# Patient Record
Sex: Male | Born: 1970 | Race: White | Hispanic: No | Smoking: Never smoker
Health system: Southern US, Academic
[De-identification: ages and names within clinical notes are randomized; demographics above are authoritative.]

## PROBLEM LIST (undated history)

## (undated) DIAGNOSIS — R6889 Other general symptoms and signs: Secondary | ICD-10-CM

## (undated) DIAGNOSIS — G8929 Other chronic pain: Secondary | ICD-10-CM

## (undated) DIAGNOSIS — E538 Deficiency of other specified B group vitamins: Secondary | ICD-10-CM

## (undated) DIAGNOSIS — N200 Calculus of kidney: Secondary | ICD-10-CM

## (undated) DIAGNOSIS — Z9981 Dependence on supplemental oxygen: Secondary | ICD-10-CM

## (undated) DIAGNOSIS — T148XXA Other injury of unspecified body region, initial encounter: Secondary | ICD-10-CM

## (undated) DIAGNOSIS — M549 Dorsalgia, unspecified: Secondary | ICD-10-CM

## (undated) DIAGNOSIS — I219 Acute myocardial infarction, unspecified: Secondary | ICD-10-CM

## (undated) DIAGNOSIS — M6281 Muscle weakness (generalized): Secondary | ICD-10-CM

## (undated) HISTORY — PX: NECK SURGERY: SHX720

## (undated) HISTORY — PX: SHOULDER SURGERY: SHX246

## (undated) HISTORY — PX: HAND RECONSTRUCTION: SHX1730

## (undated) HISTORY — PX: HX GALL BLADDER SURGERY/CHOLE: SHX55

## (undated) HISTORY — PX: HX APPENDECTOMY: SHX54

## (undated) HISTORY — PX: HX BACK SURGERY: SHX140

## (undated) HISTORY — PX: KNEE SURGERY: SHX244

## (undated) HISTORY — DX: Dorsalgia, unspecified: M54.9

---
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
## 2018-03-02 IMAGING — CR XRAY LUMBAR SPINE COMPLETE
1 series · 5 of 5 positions shown · non-contrast
Comparison: None previous available.

Exam:   

Lumbosacral spine complete with oblique views 4
HISTORY: Back pain. Trauma due to fall a few weeks ago.

[Series 1: view not recorded · 0.17mm/px · 5 of 5 slices shown]
[im 1/5]
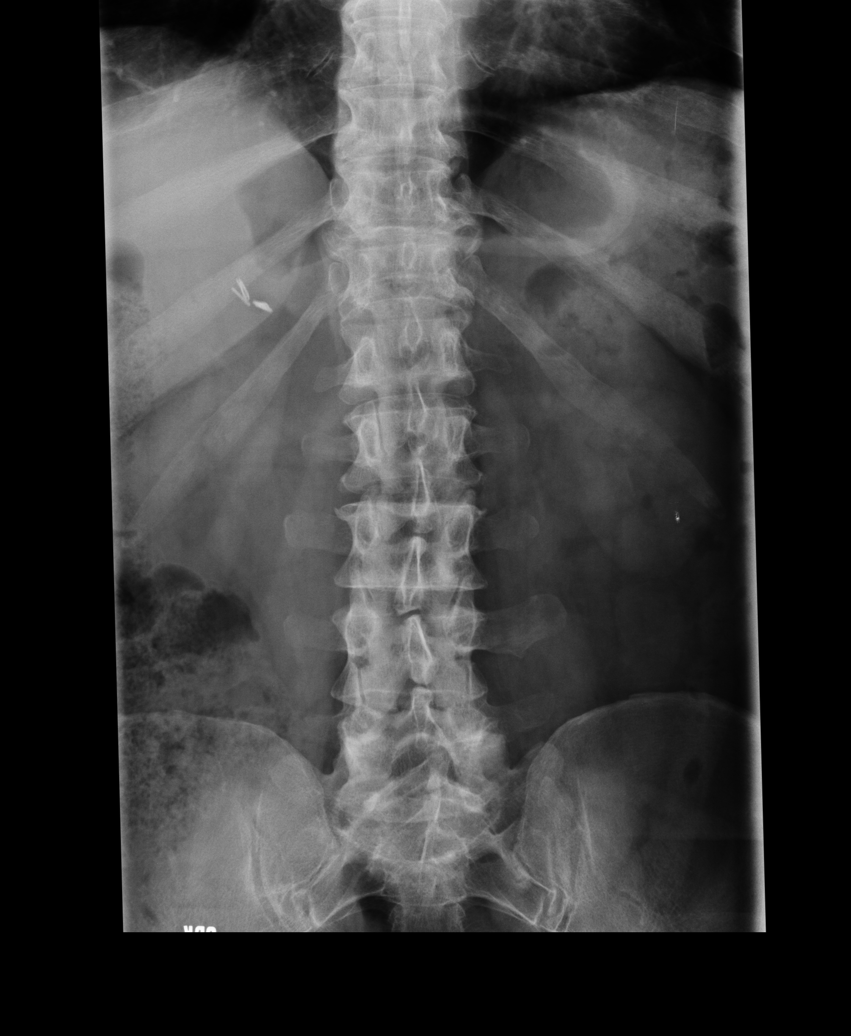
[im 2/5]
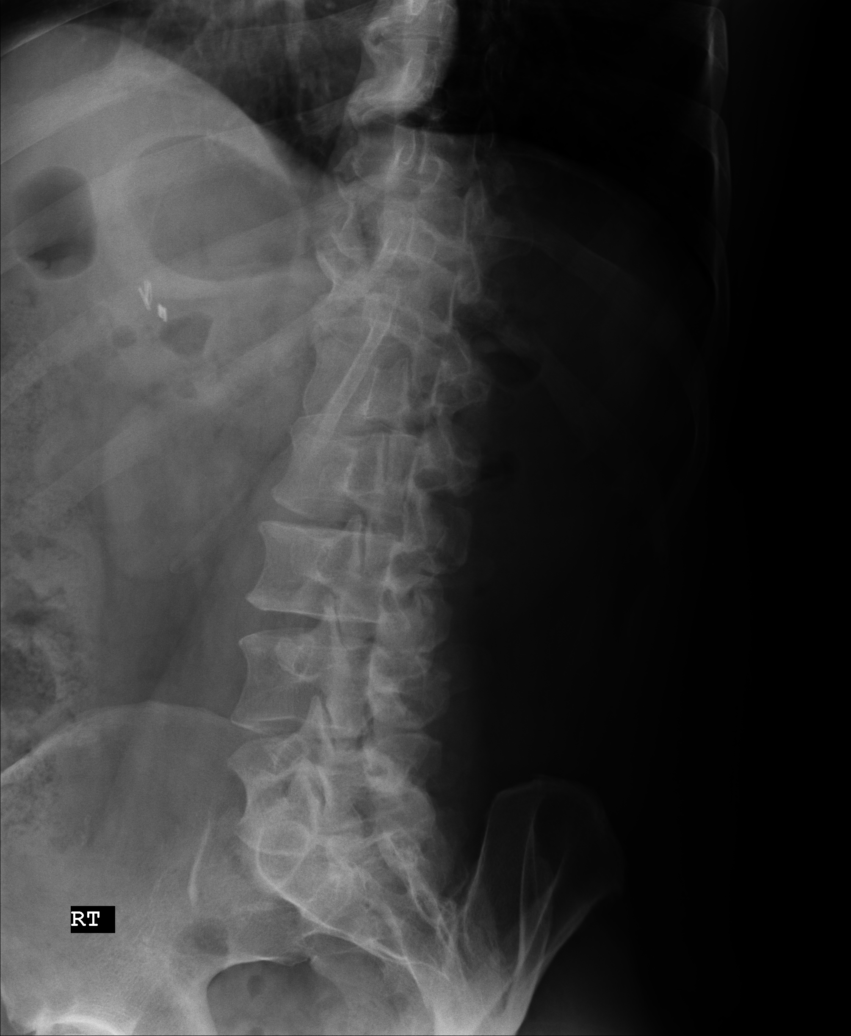
[im 3/5]
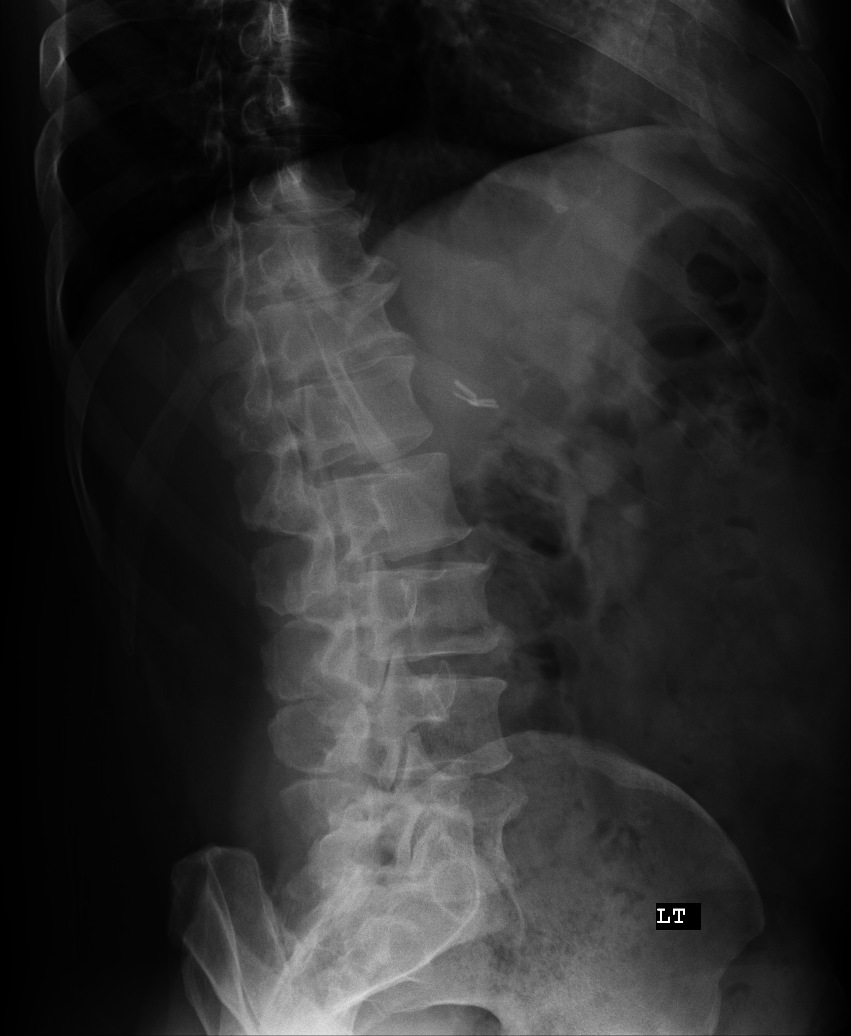
[im 4/5]
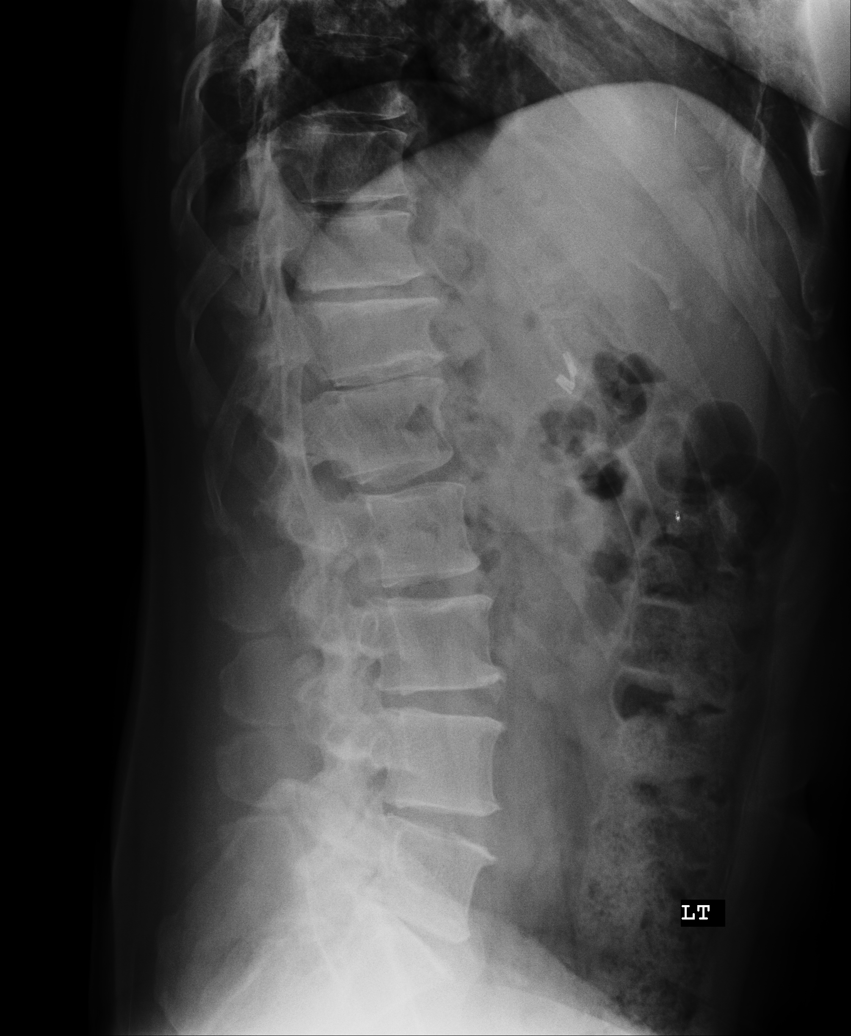
[im 5/5]
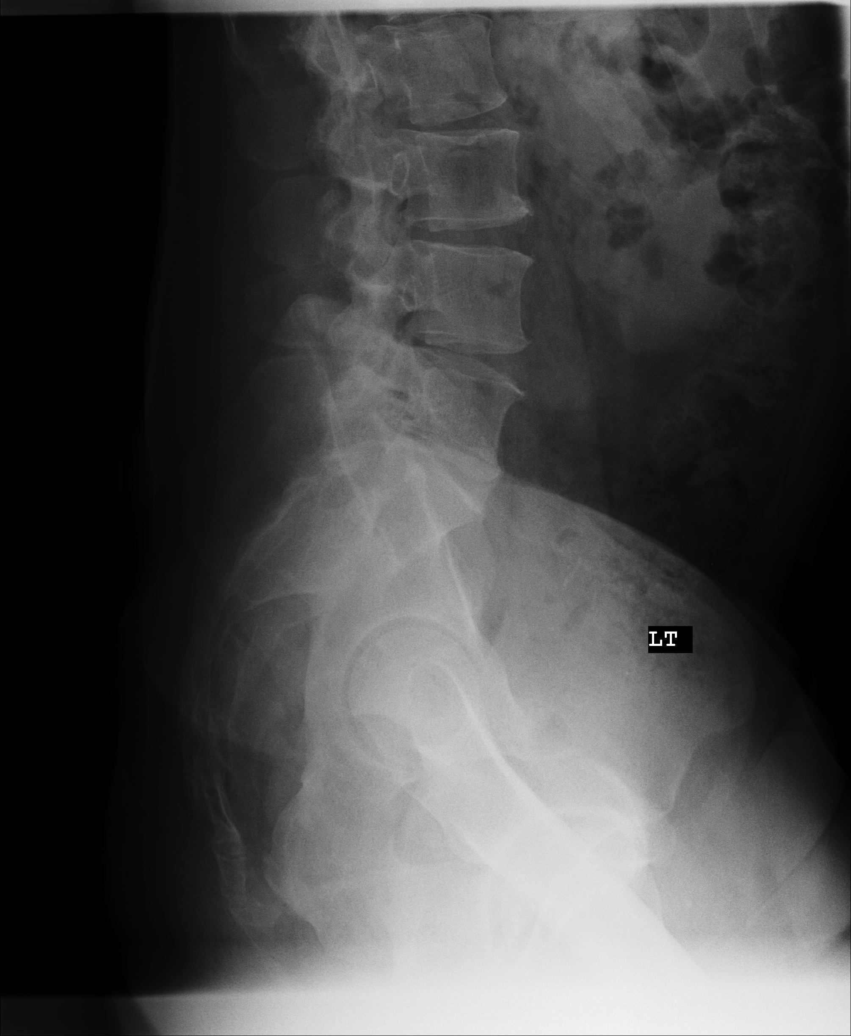

[5 of 5 positions shown; findings below may reference images not displayed]

FINDINGS: No acute findings of lumbar vertebrae are seen. Significant multi-level degenerative disc changes are seen in the mid lumbar region. Degenerative disc disease is also noted in the lower thoracic region.
IMPRESSION: Multi-level degenerative disc changes of lower thoracic and mid lumbar discs. No acute bony lesions are visible. If symptoms are localized and persistent, further evaluation with MRI is indicated.

## 2018-11-15 IMAGING — US US RETROPERITONEAL COMPLETE
1 series · 14 of 25 positions shown · non-contrast
Comparison: None available.

EXAM:  US RETROPERITONEAL COMPLETE,

EXAM:   US PELVIC LIMITED
INDICATION: Hematuria.

[Series 4: us retroperitoneal complete · 0.35mm/px · 14 of 70 slices shown]
[im 1/70]
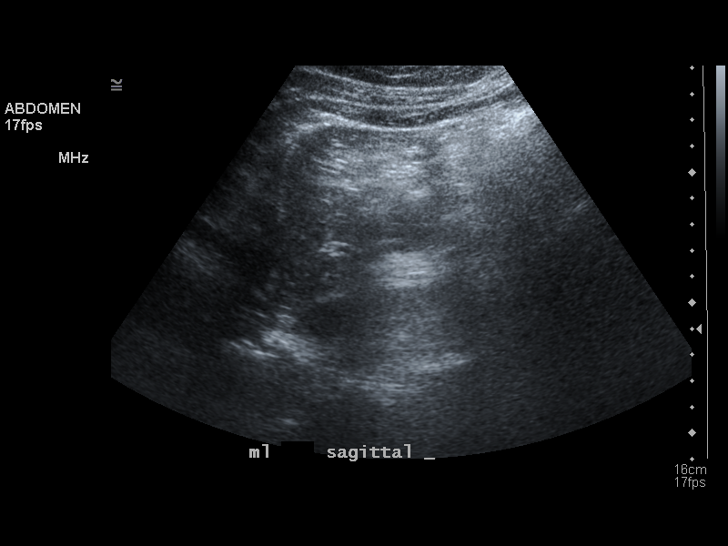
[im 6/70]
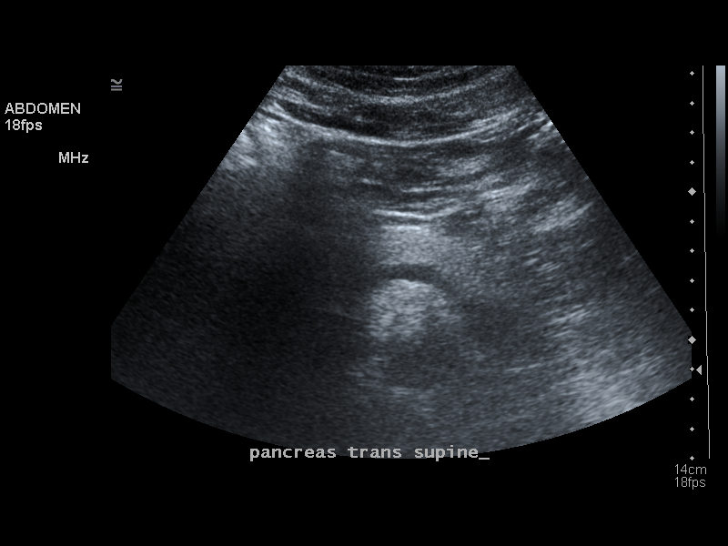
[im 12/70]
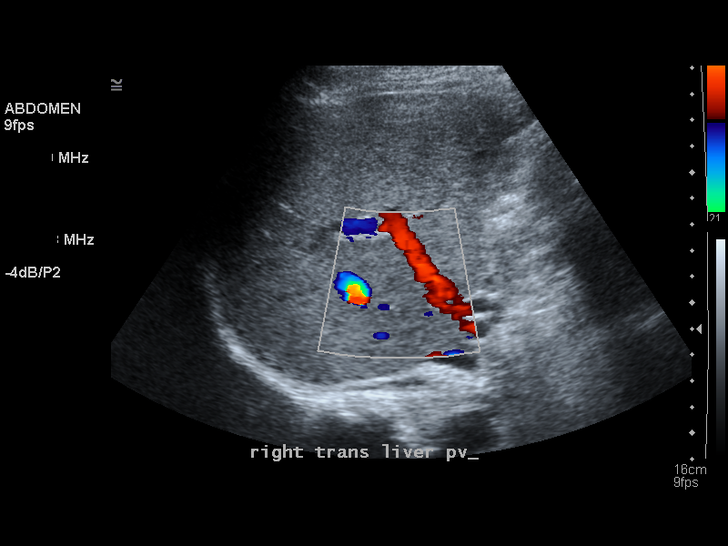
[im 18/70]
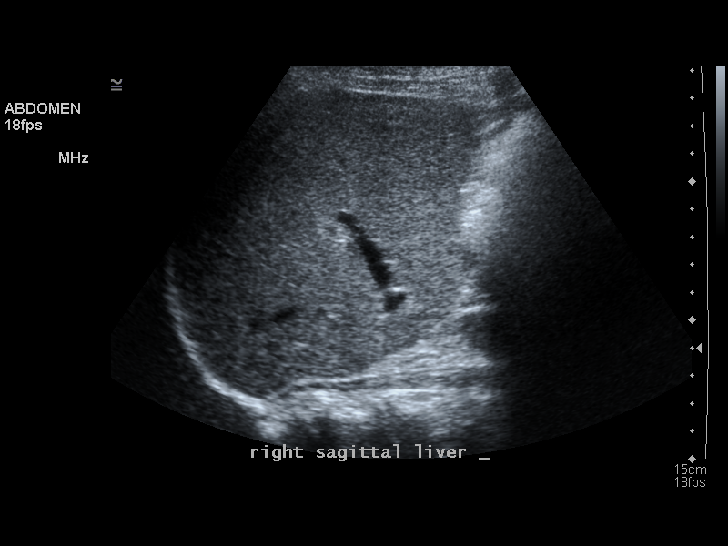
[im 24/70]
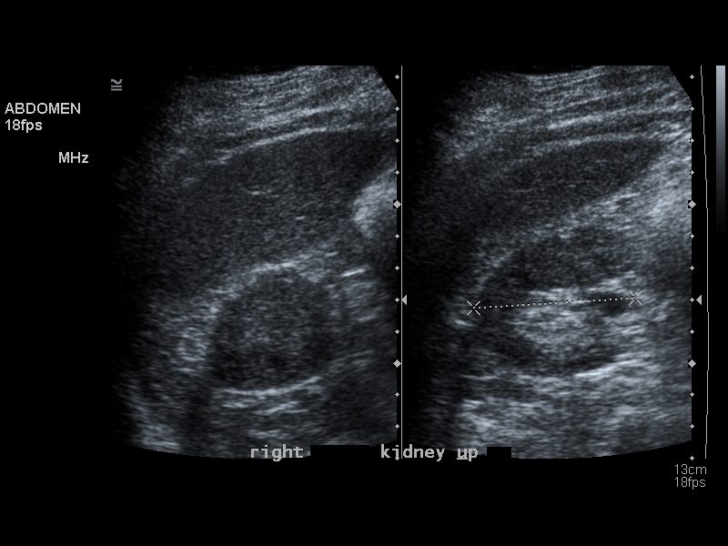
[im 26/70]
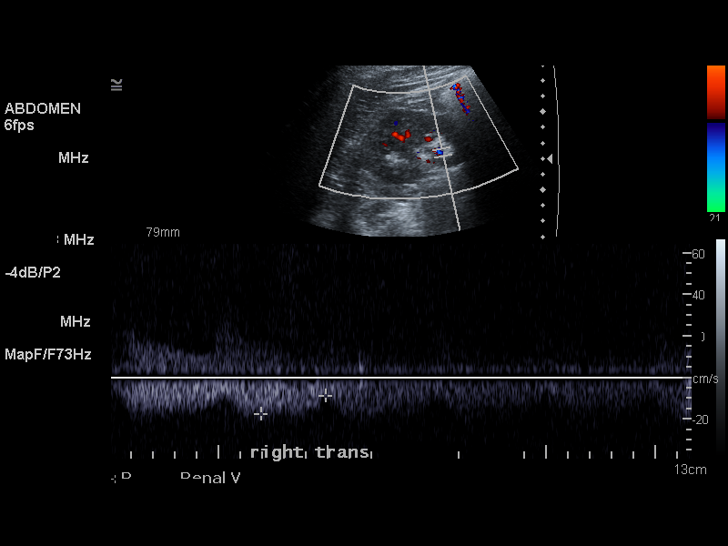
[im 32/70]
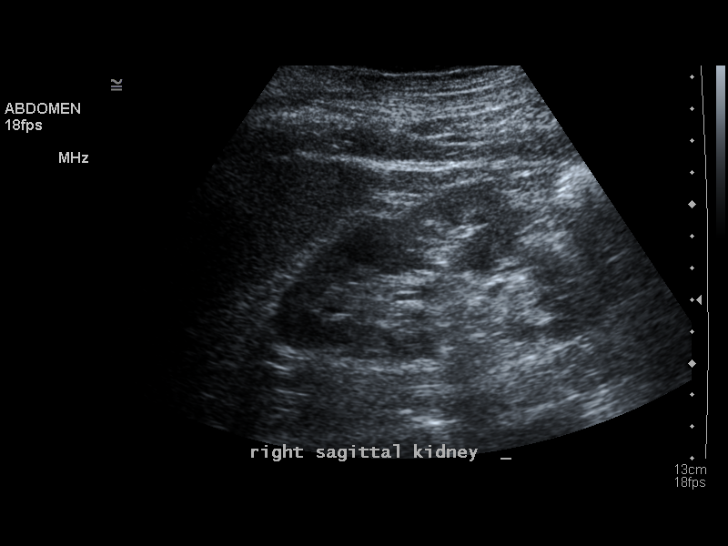
[im 38/70]
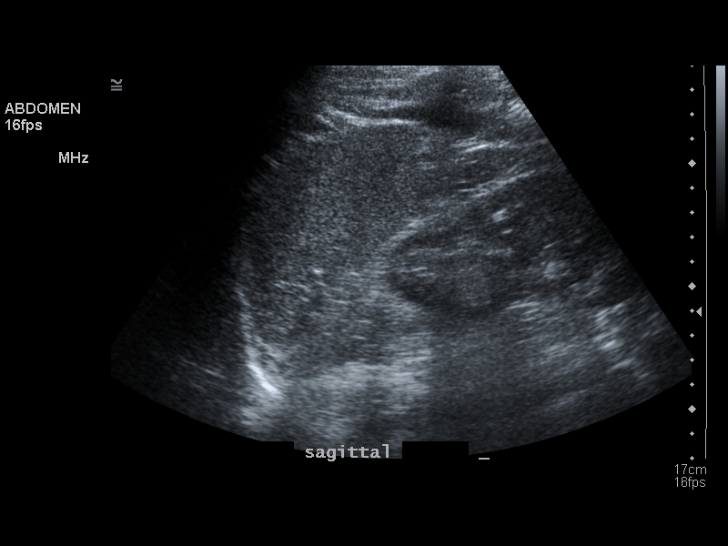
[im 44/70]
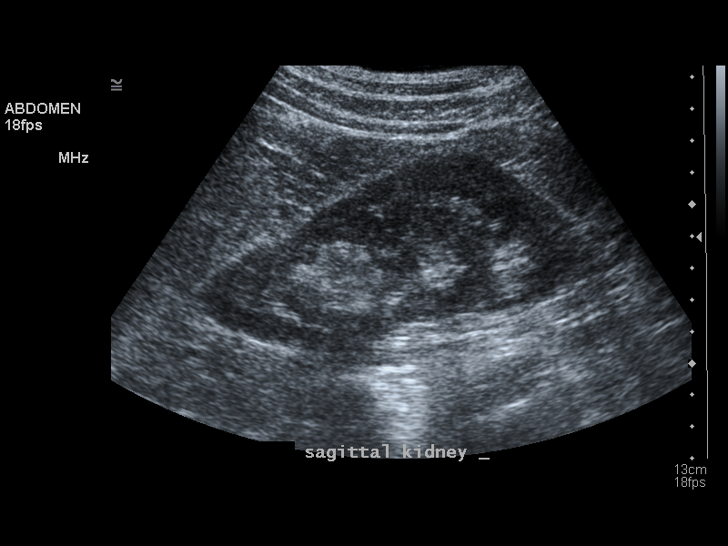
[im 47/70]
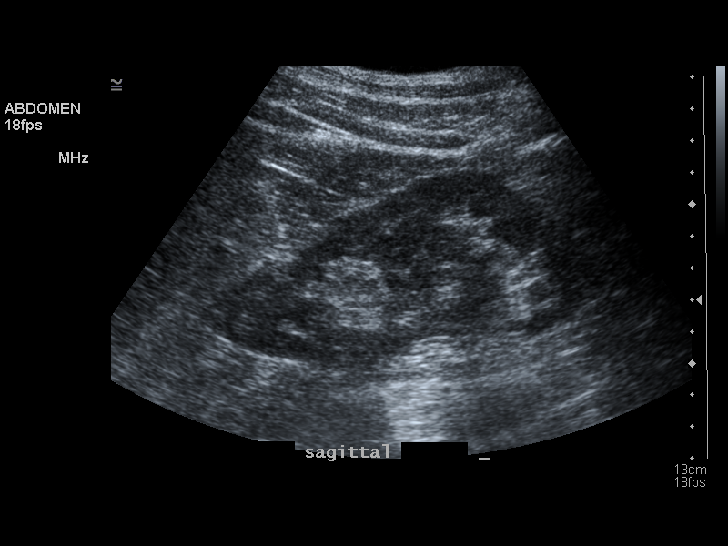
[im 52/70]
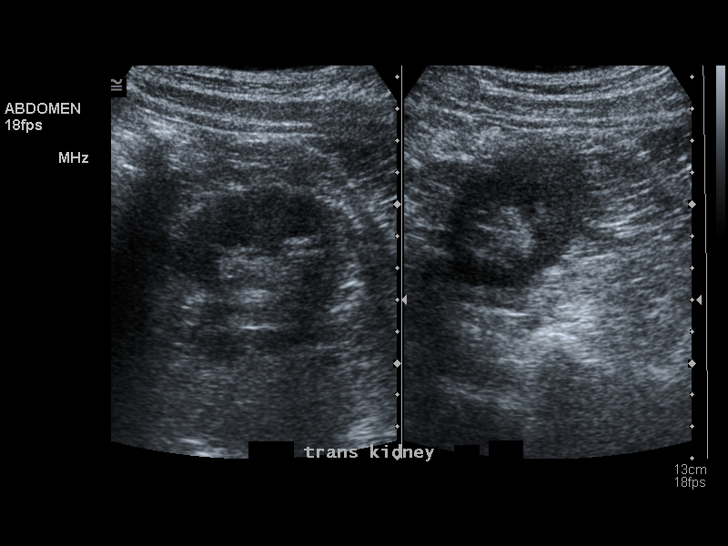
[im 58/70]
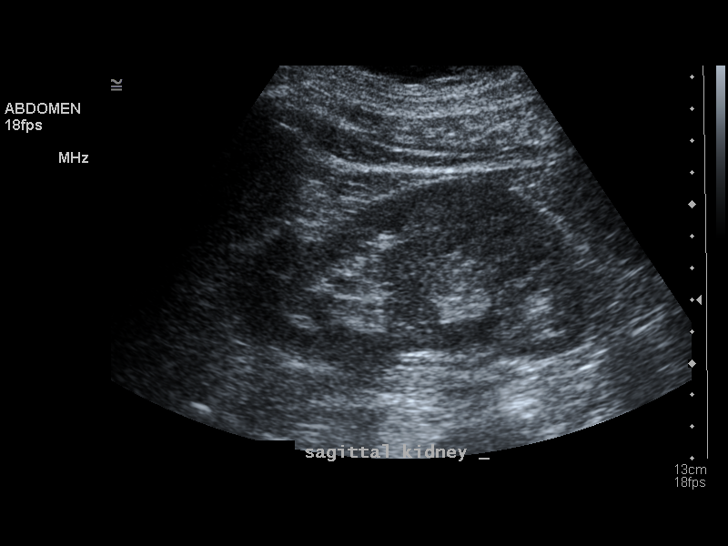
[im 64/70]
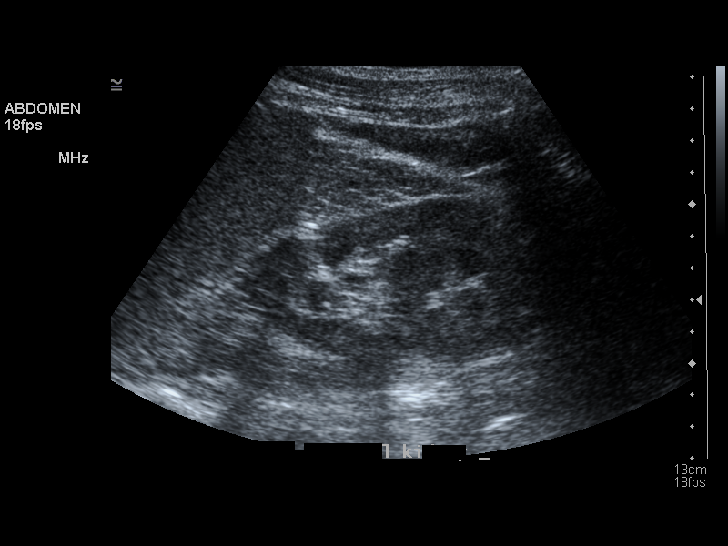
[im 70/70]
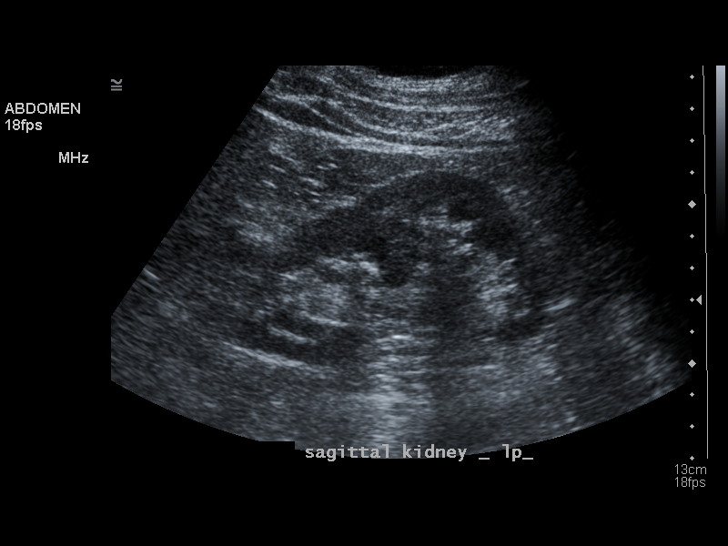

[14 of 25 positions shown; findings below may reference images not displayed]

FINDINGS: Kidneys are normal in echogenicity. Right kidney measures 10.5 cm and left kidney measures 11.5 cm. There is no hydronephrosis, mass, cyst or shadowing calculus on either side.

Urinary bladder is partially distended and grossly unremarkable. There is no significant post void residual.
IMPRESSION: Essentially unremarkable exam. Please consider further evaluation with CT IVP for persistent or worsening symptoms.

## 2018-11-15 IMAGING — US US PELVIC COMPLETE
1 series · 14 of 18 positions shown · non-contrast
Comparison: None available.

EXAM:  US RETROPERITONEAL COMPLETE,

EXAM:   US PELVIC LIMITED
INDICATION: Hematuria.

[Series 1: us pelvic complete · 0.27mm/px · 14 of 18 slices shown]
[im 1/18]
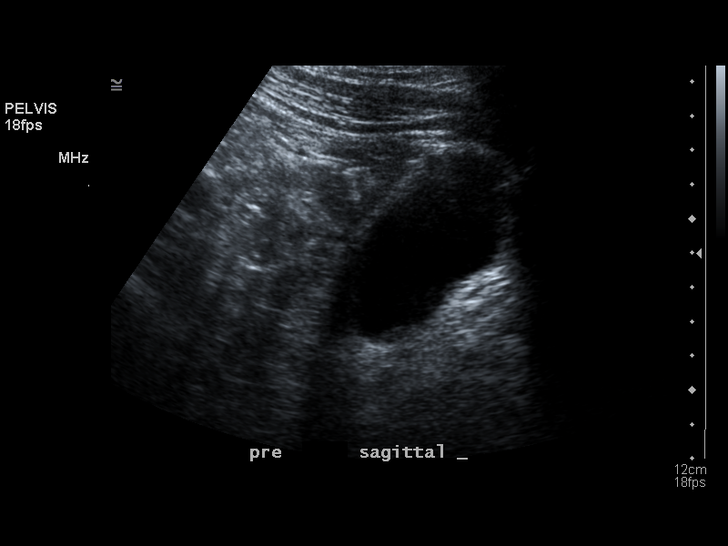
[im 2/18]
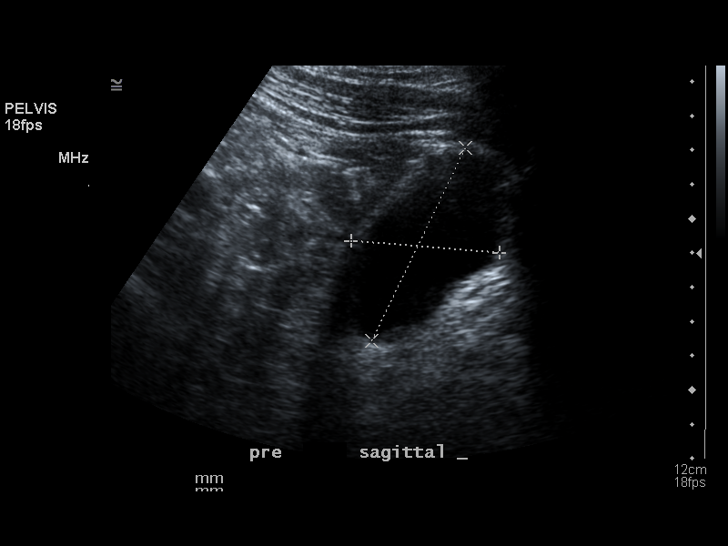
[im 4/18]
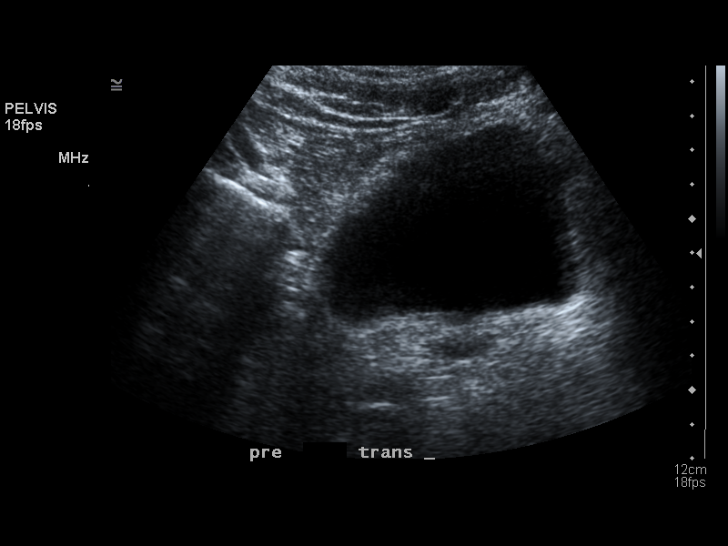
[im 5/18]
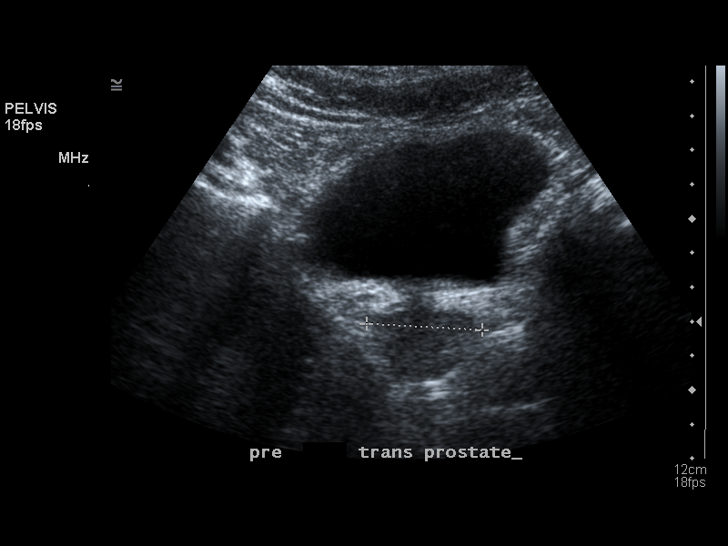
[im 6/18]
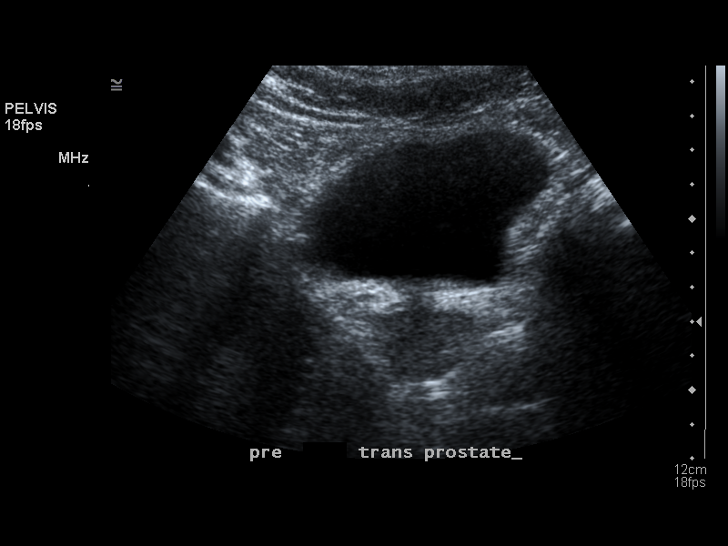
[im 8/18]
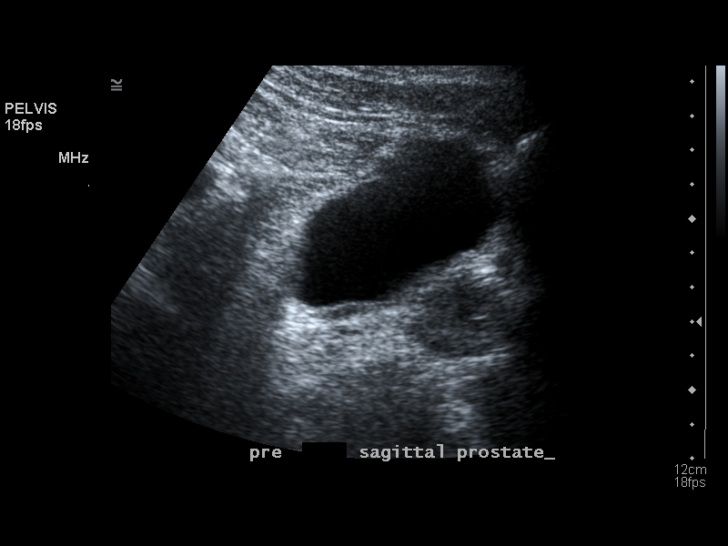
[im 9/18]
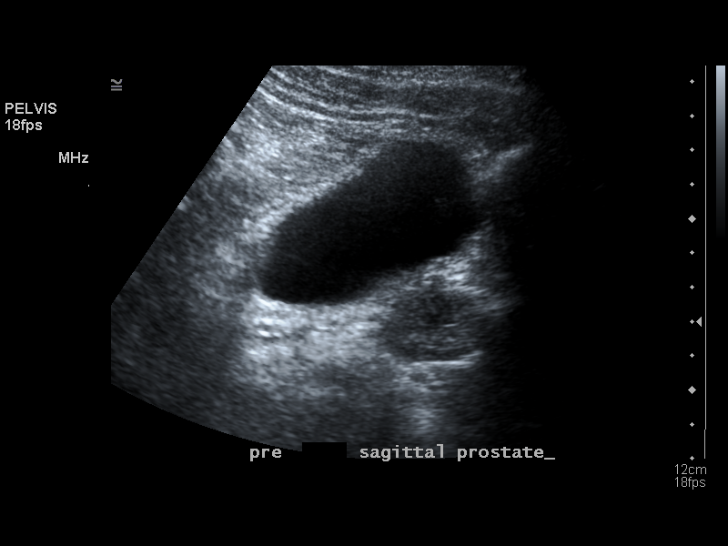
[im 10/18]
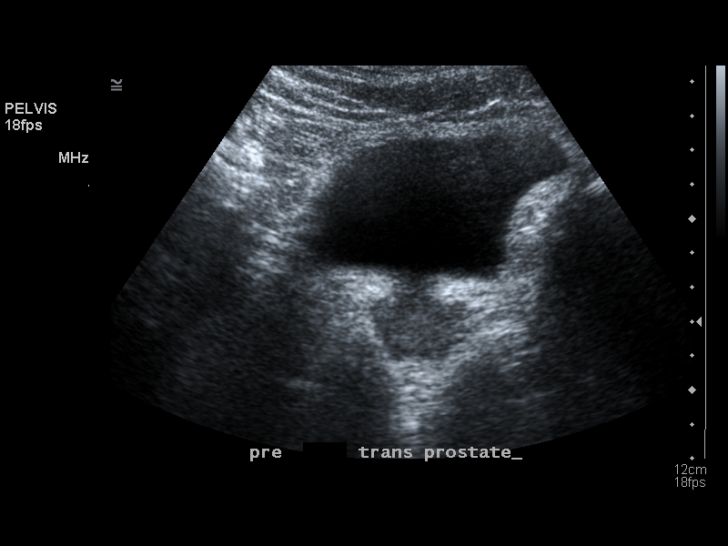
[im 11/18]
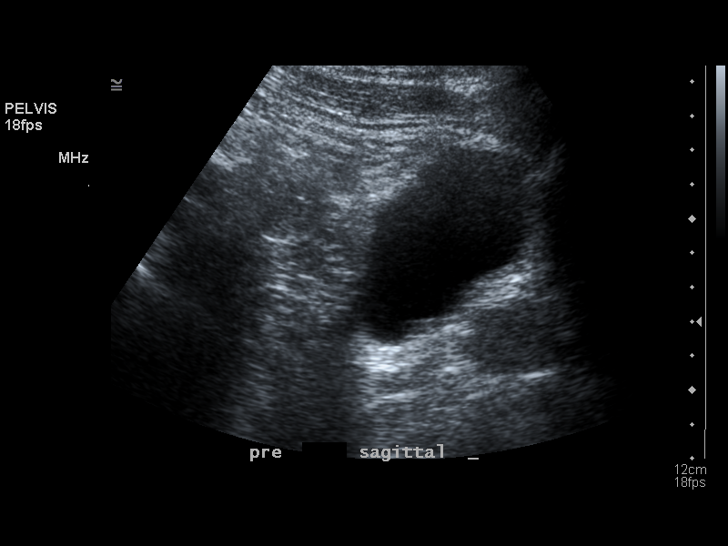
[im 13/18]
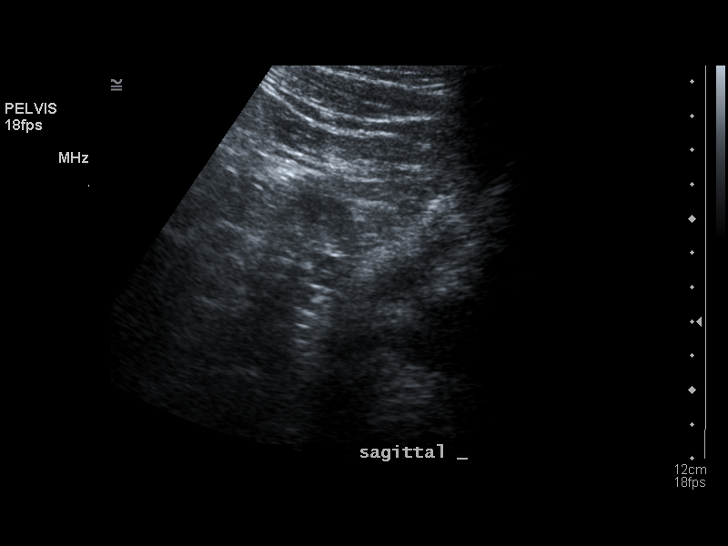
[im 14/18]
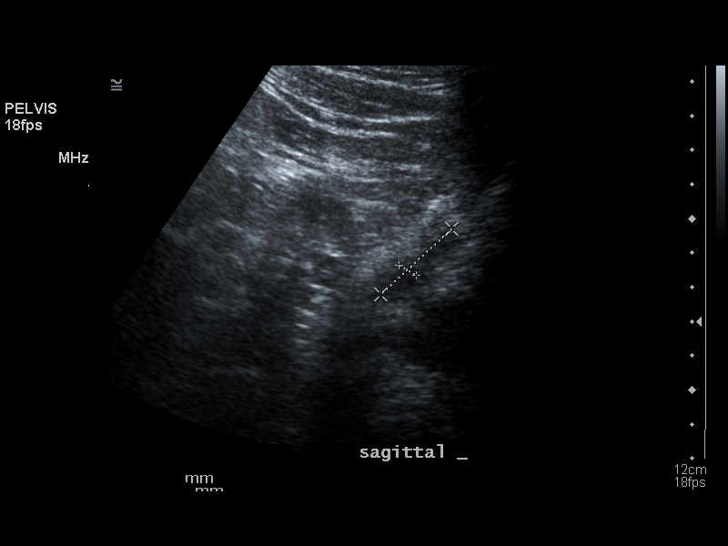
[im 15/18]
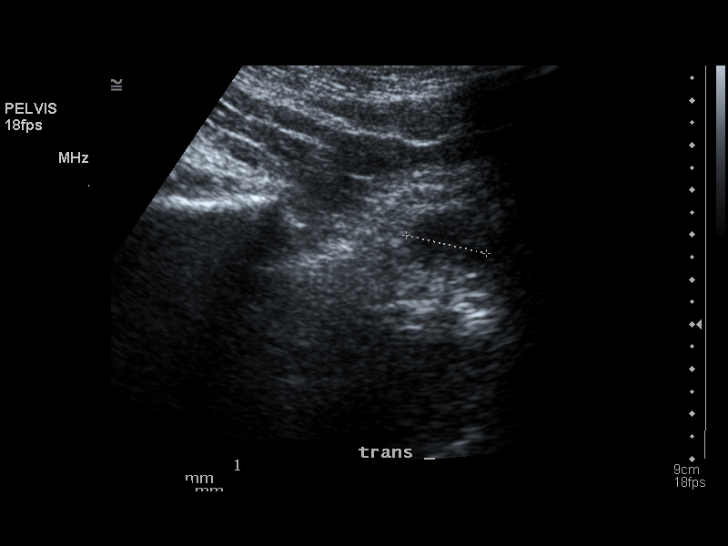
[im 17/18]
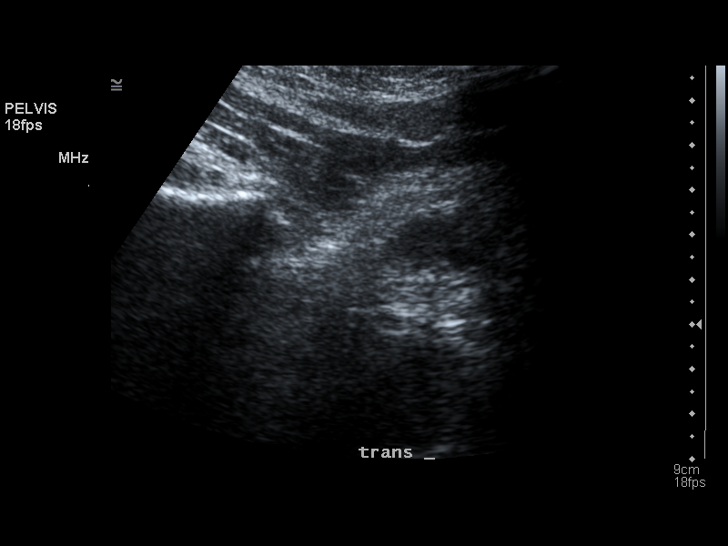
[im 18/18]
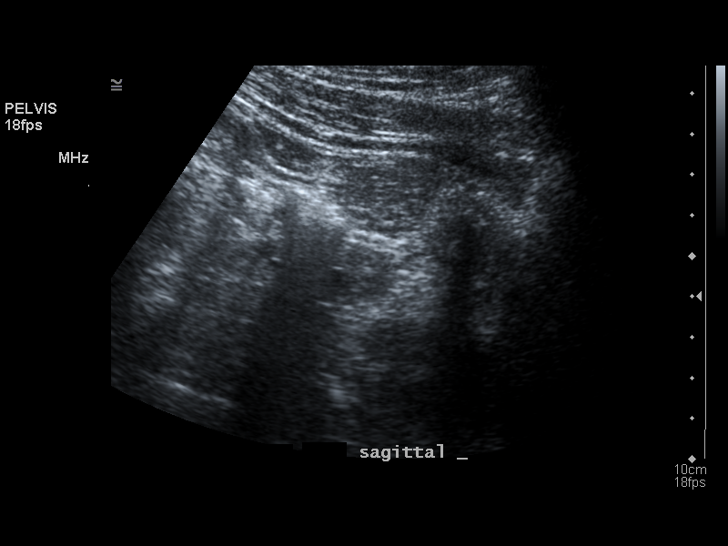

[14 of 18 positions shown; findings below may reference images not displayed]

FINDINGS: Kidneys are normal in echogenicity. Right kidney measures 10.5 cm and left kidney measures 11.5 cm. There is no hydronephrosis, mass, cyst or shadowing calculus on either side.

Urinary bladder is partially distended and grossly unremarkable. There is no significant post void residual.
IMPRESSION: Essentially unremarkable exam. Please consider further evaluation with CT IVP for persistent or worsening symptoms.

## 2019-10-13 IMAGING — CR XRAY KNEE 3 VIEWS LT
1 series · 3 of 3 positions shown · non-contrast
Comparison: None available.

﻿EXAM:  XRAY KNEE 3 VIEWS LT
INDICATION: Left knee pain.

[Series 1: view not recorded · 0.17mm/px · 3 of 3 slices shown]
[im 1/3]
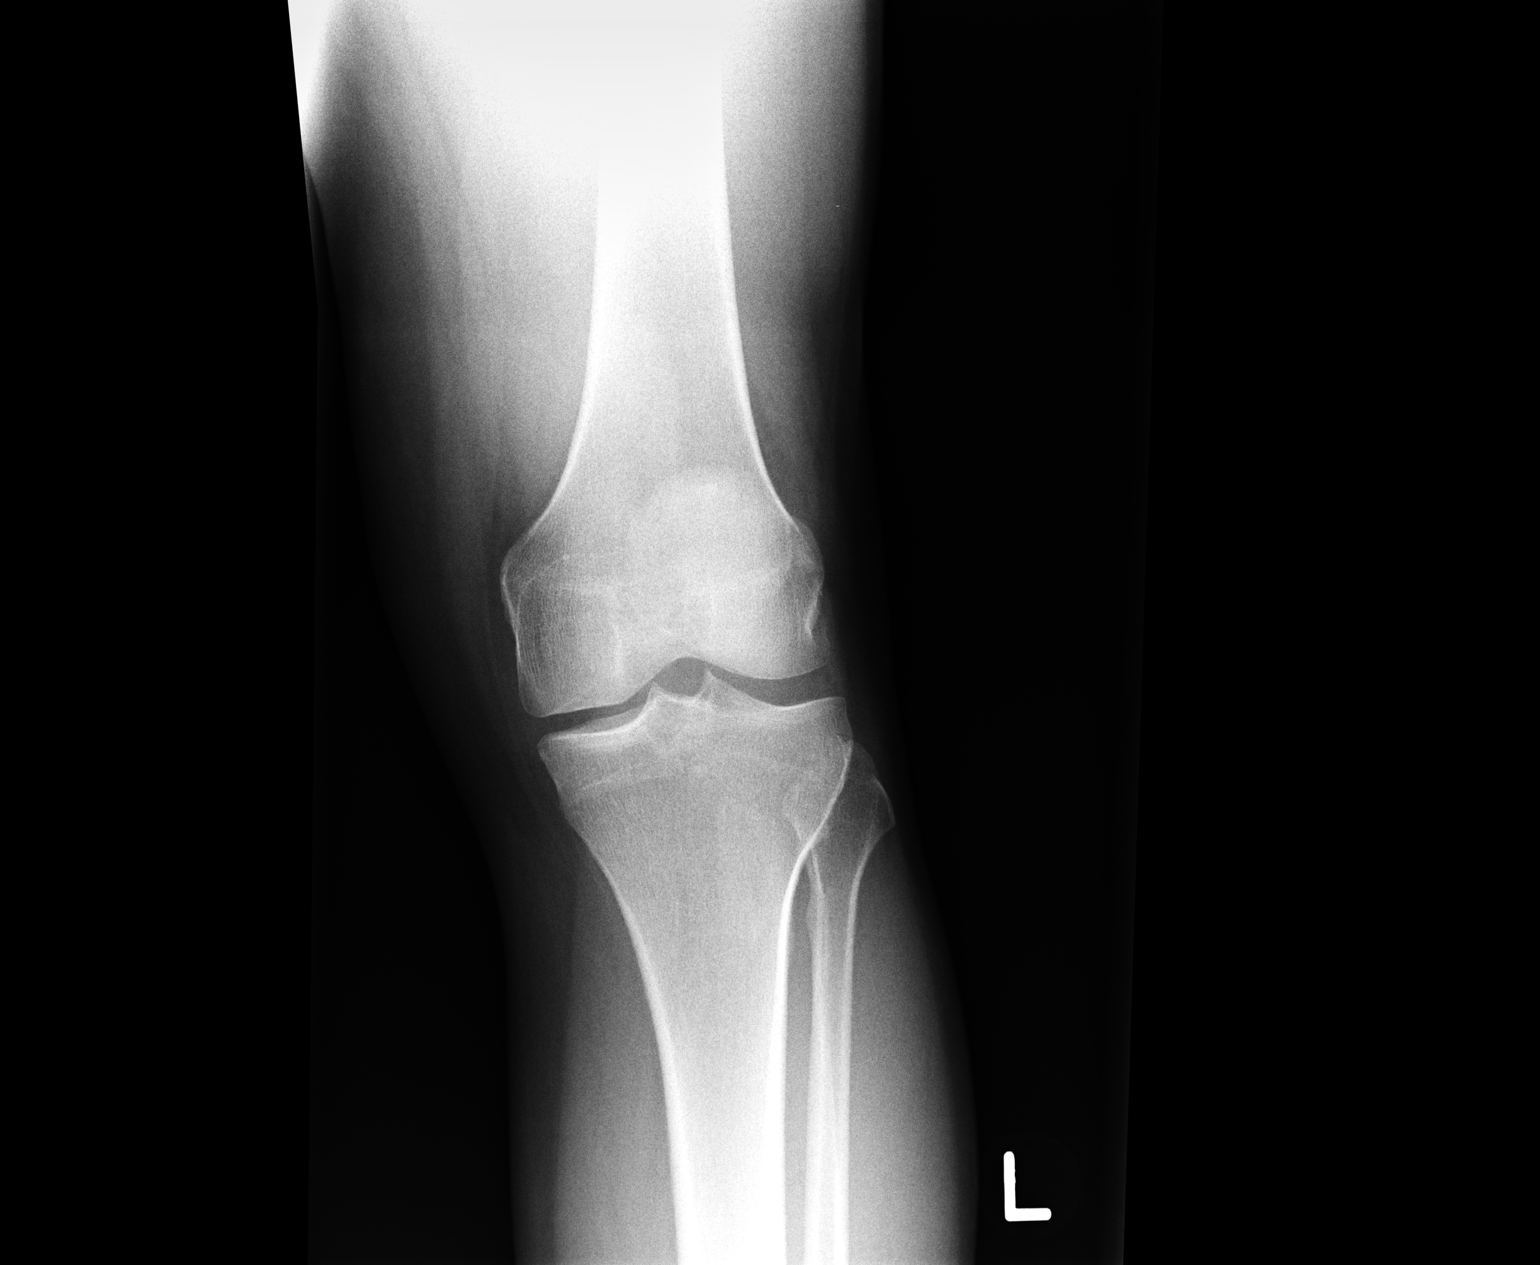
[im 2/3]
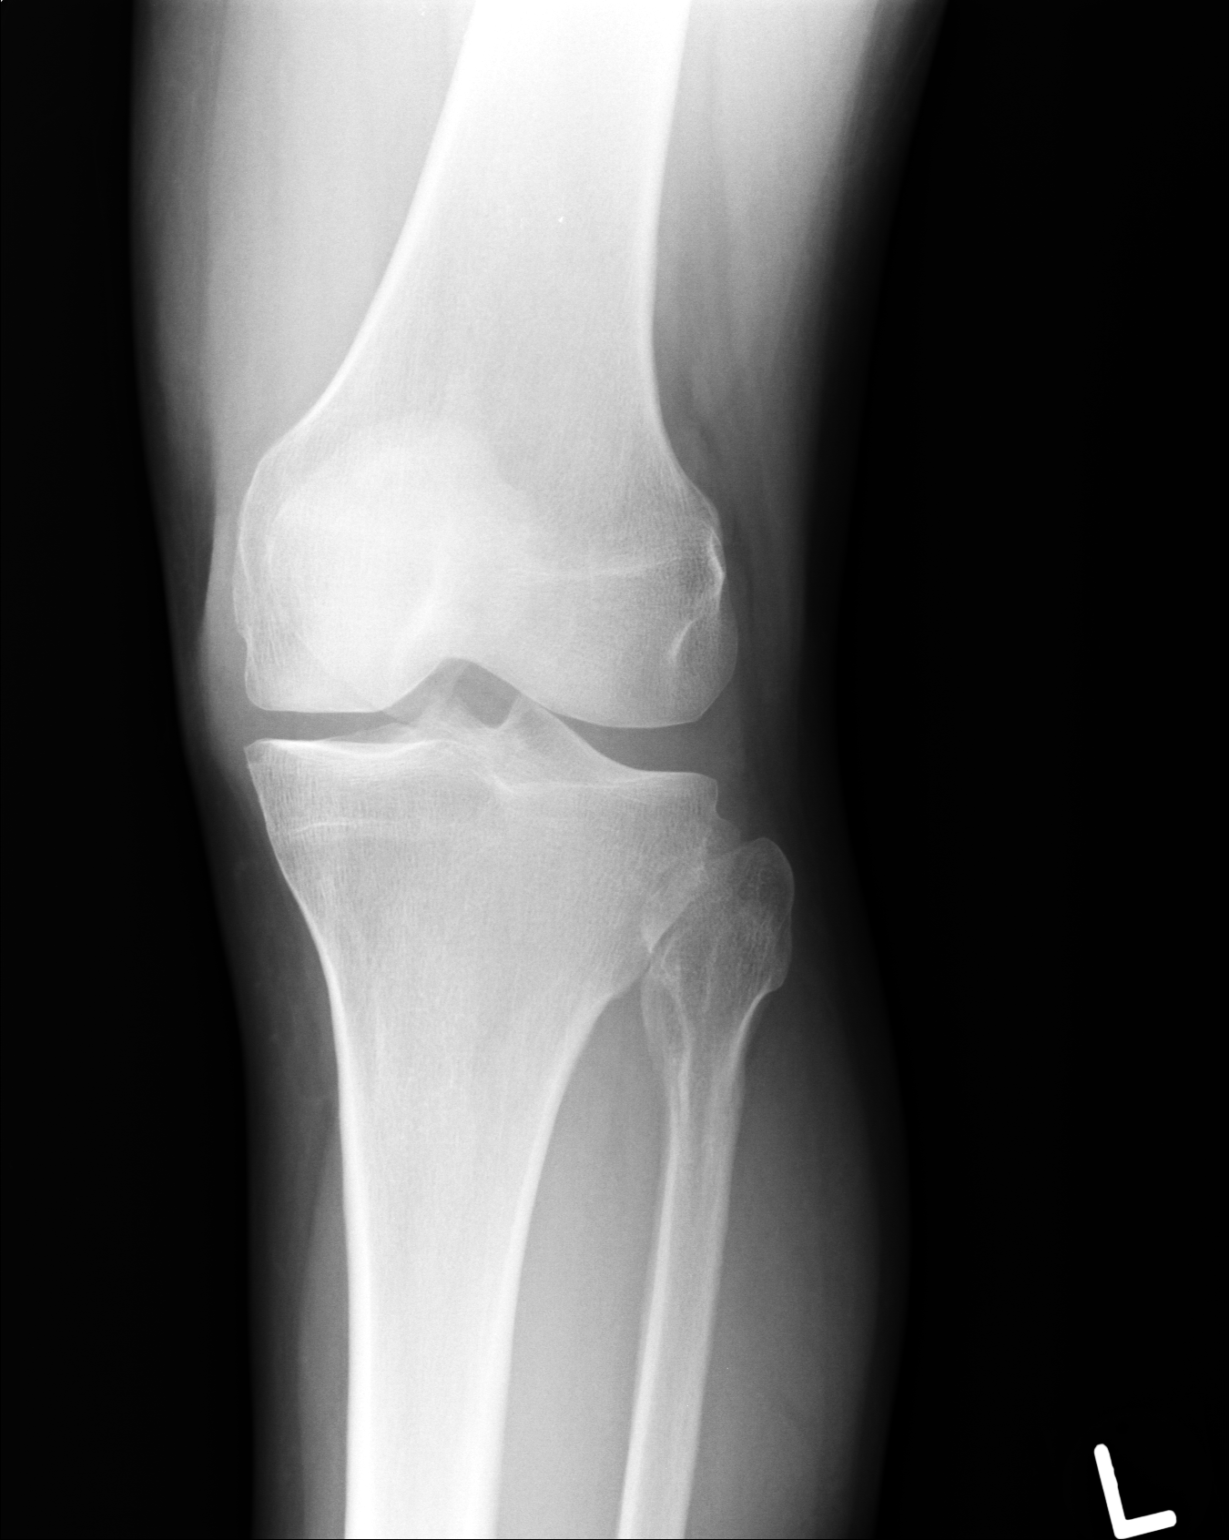
[im 3/3]
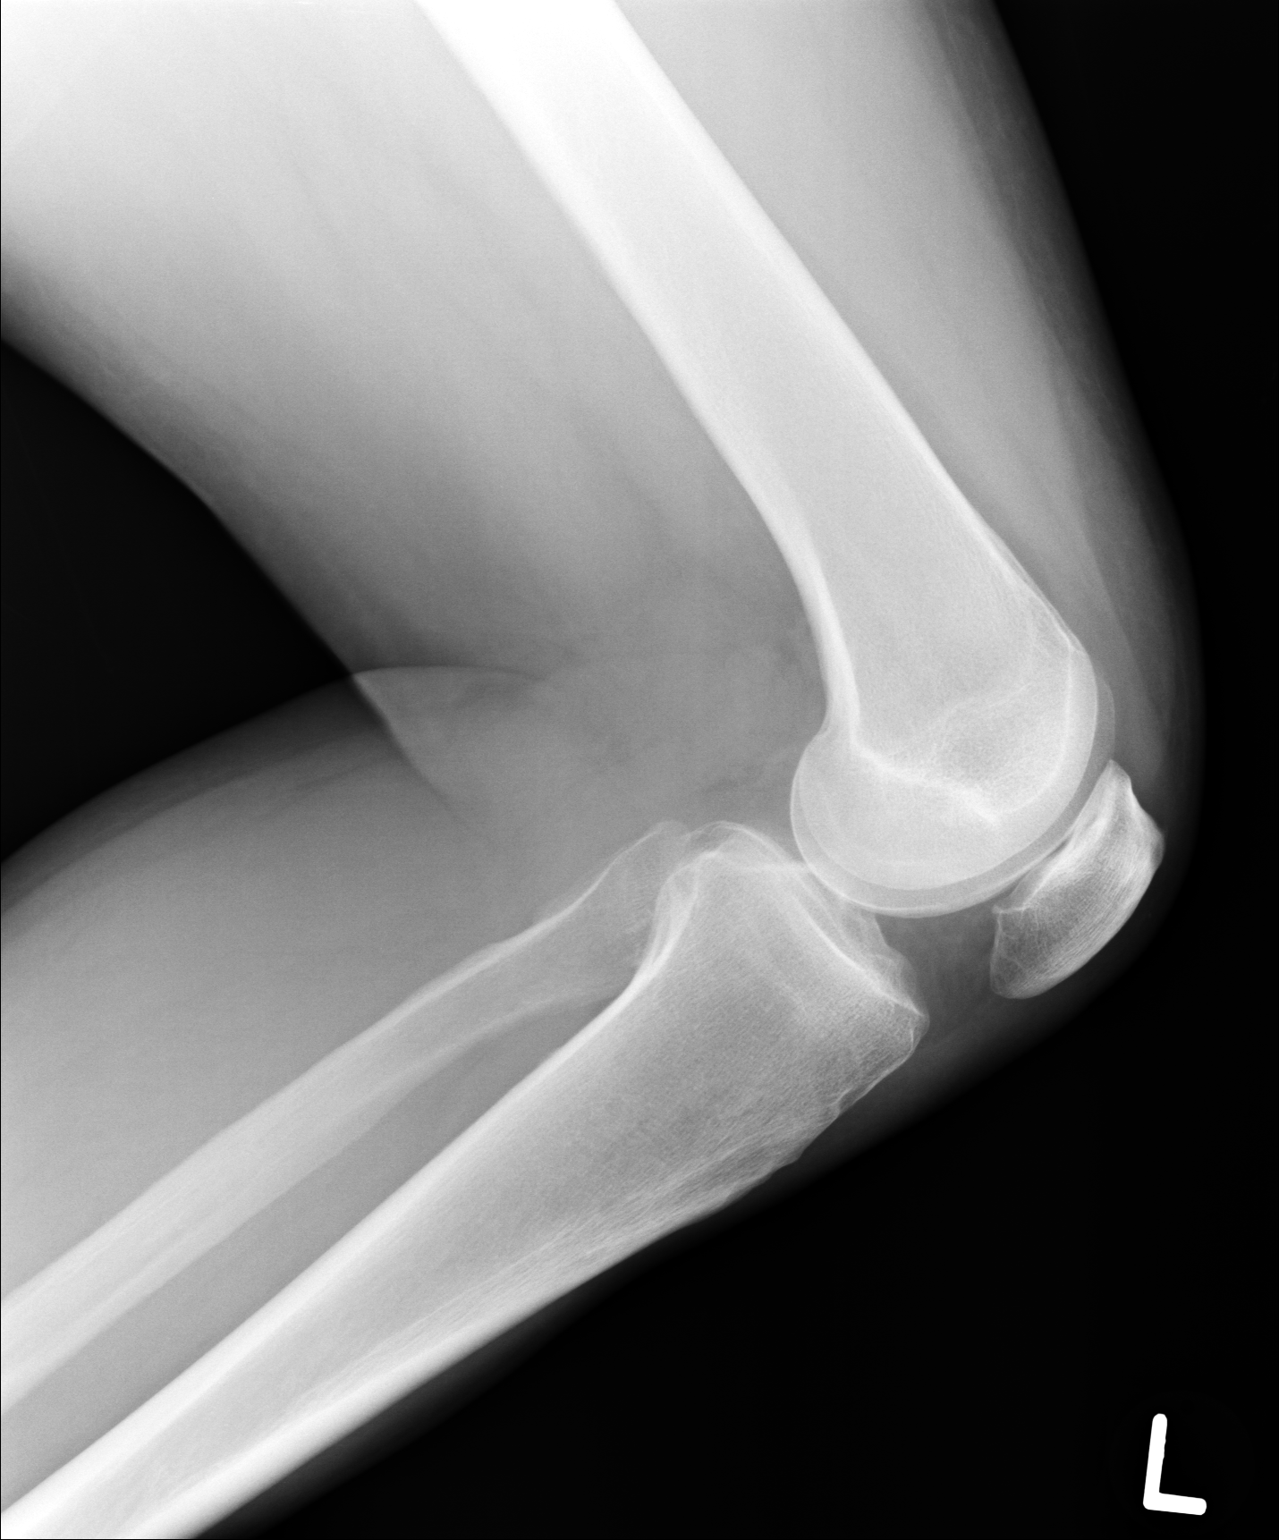

[3 of 3 positions shown; findings below may reference images not displayed]

FINDINGS: There is no acute fracture or subluxation. No significant arthritic changes are seen. There is no suprapatellar effusion. There is no soft tissue abnormality.
IMPRESSION: Unremarkable exam.

## 2020-03-21 IMAGING — CT CT HEAD/BRAIN W/O & W/ CONTRAST
3 series · 18 of 30 positions shown, 20 images · IV contrast (CONTRAST)
Comparison: None.

﻿EXAM:  [id number]   CT HEAD W/O & W/ CONTRAST
INDICATION: Persistent headaches, blurry vision.
TECHNIQUE: Axial computed tomography was performed both before and after the intravenous administration of 50 mL Optiray 320.  Exam was performed using one or more of the following dose reduction techniques: Automated exposure control, adjustment of the mA and/or kV according to patient size, or the use of iterative reconstruction technique. Radiation dose 3188 mGy cm.

[Series 8040: bone · axial · 0.42mm/px · z∈[+45,+77]mm · 3 of 83 slices shown]
[im 9/83  bone]
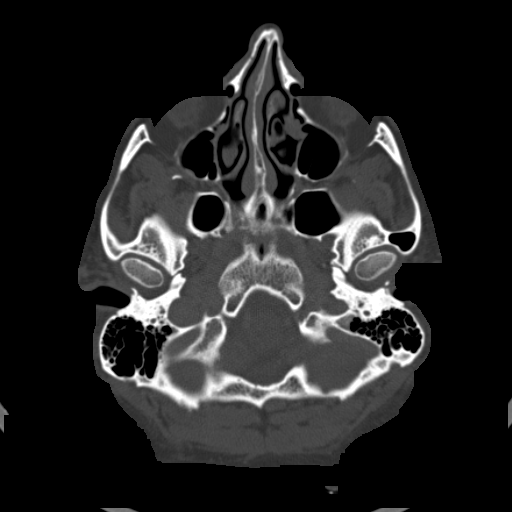
[im 17/83  bone]
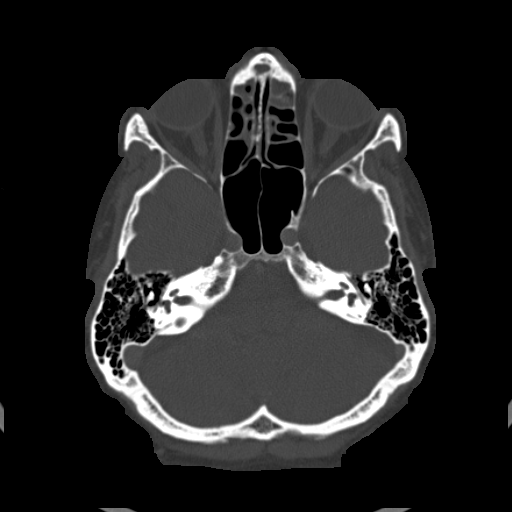
[im 25/83  bone]
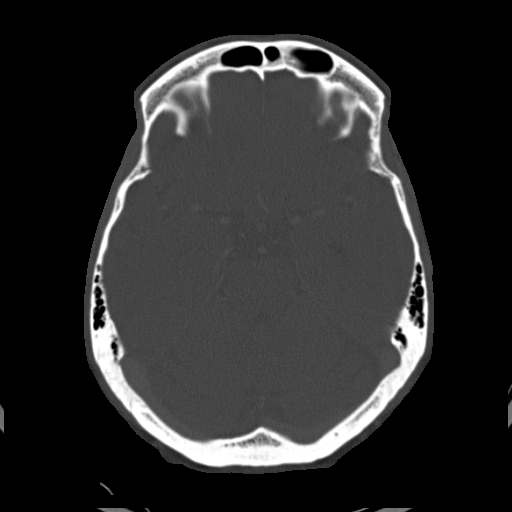

[ax wo · axial · 0.43mm/px · z∈[+55,+159]mm · 7 of 70 slices shown]
[im 9/70  brain]
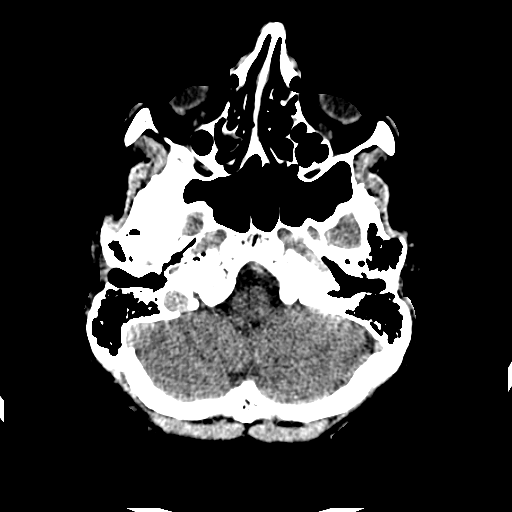
[im 18/70  brain]
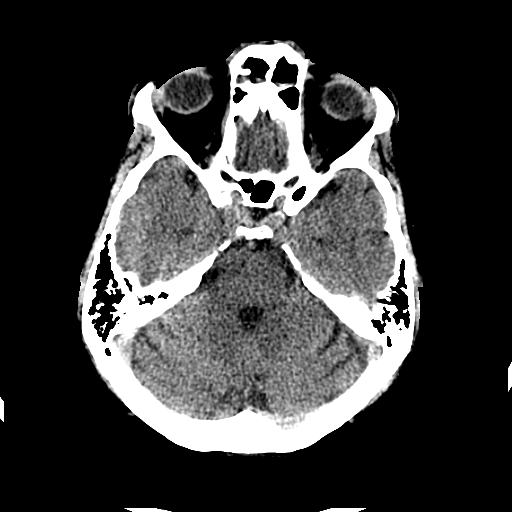
[im 26/70  brain]
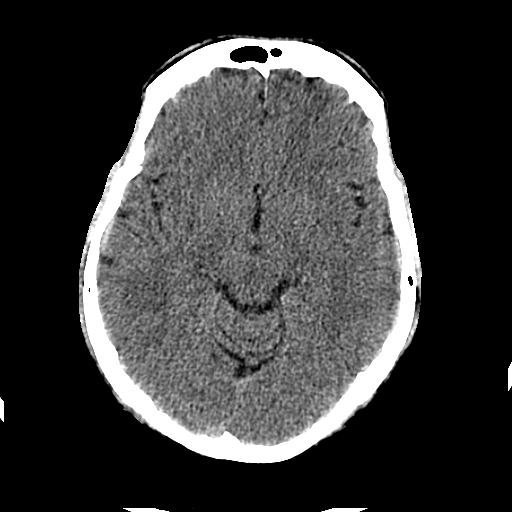
[im 35/70  brain]
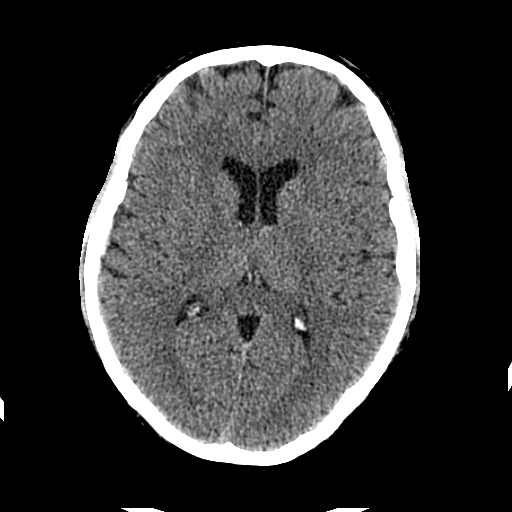
[im 44/70  brain]
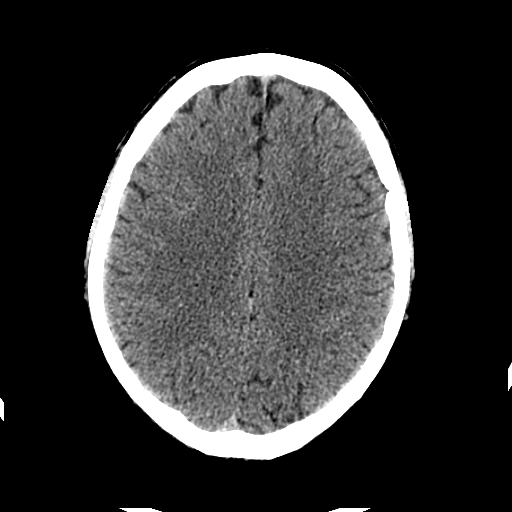
[im 52/70  brain]
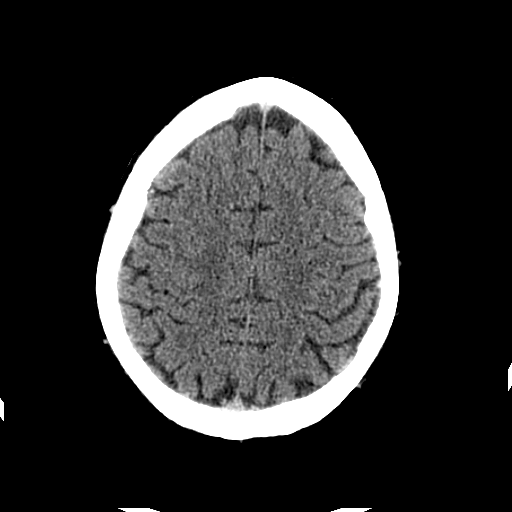
[im 61/70  brain]
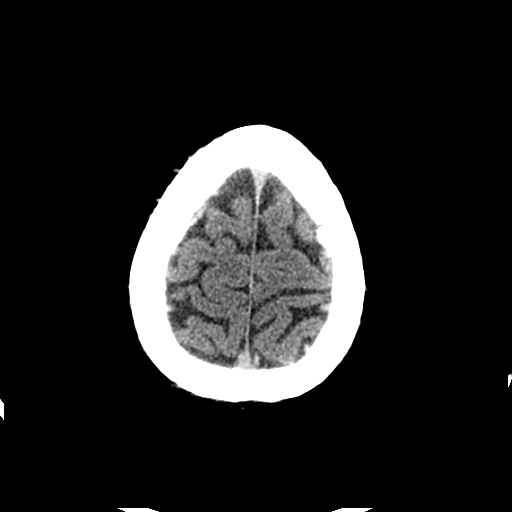

[ax post · axial · 0.42mm/px · z∈[+53,+163]mm · 8 of 71 slices shown, 10 images]
[im 8/71  brain]
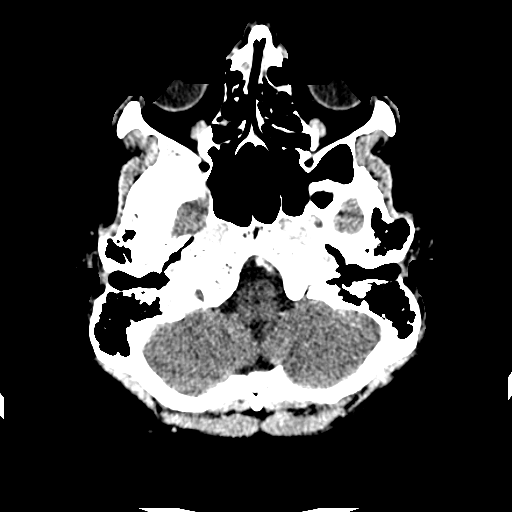
[im 8/71  bone]
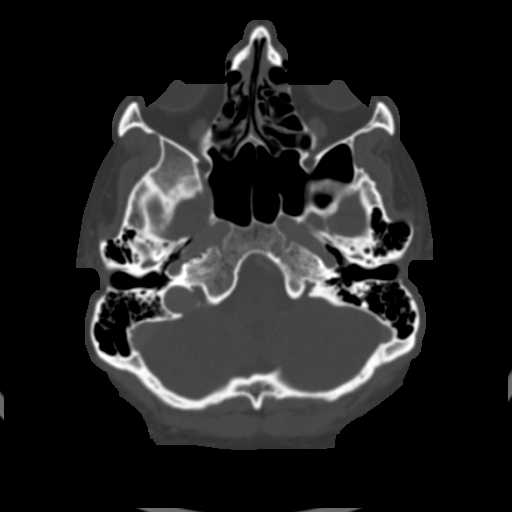
[im 16/71  brain]
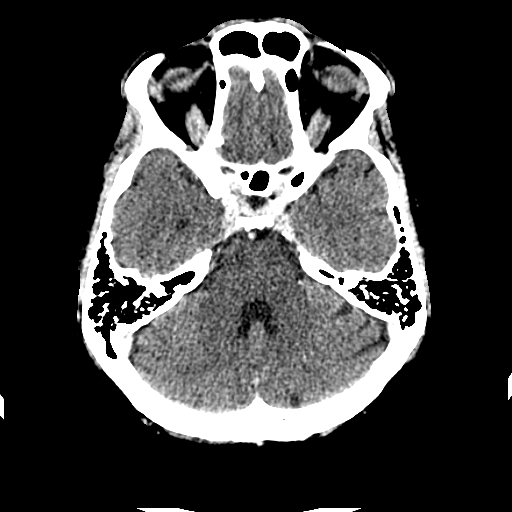
[im 24/71  brain]
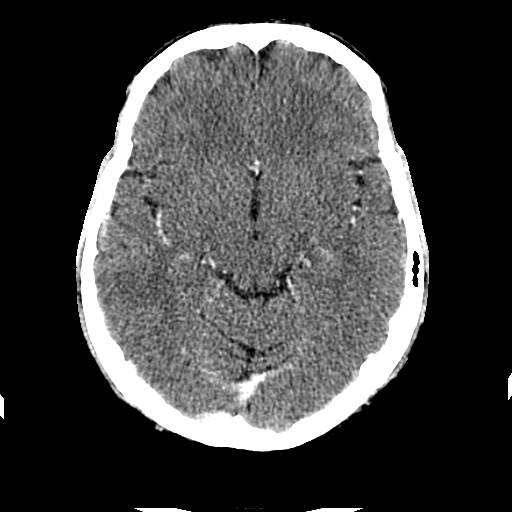
[im 32/71  brain]
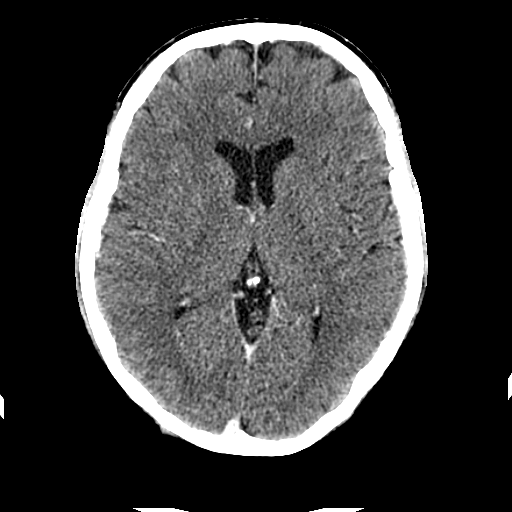
[im 39/71  brain]
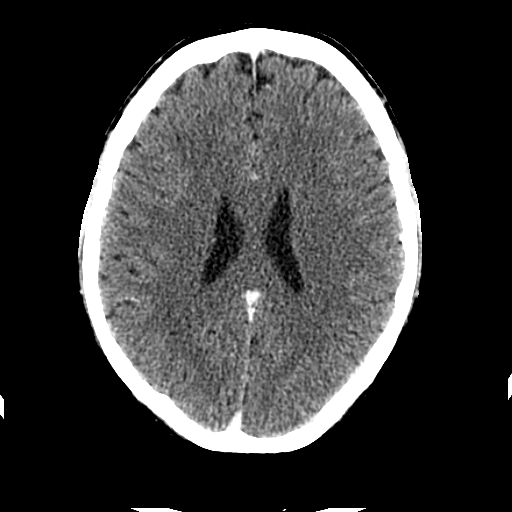
[im 39/71  bone]
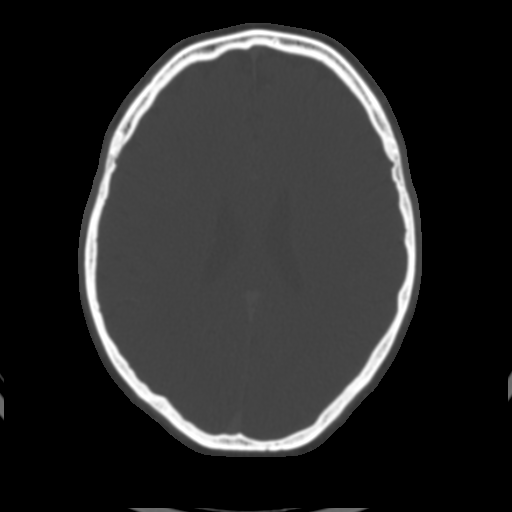
[im 47/71  brain]
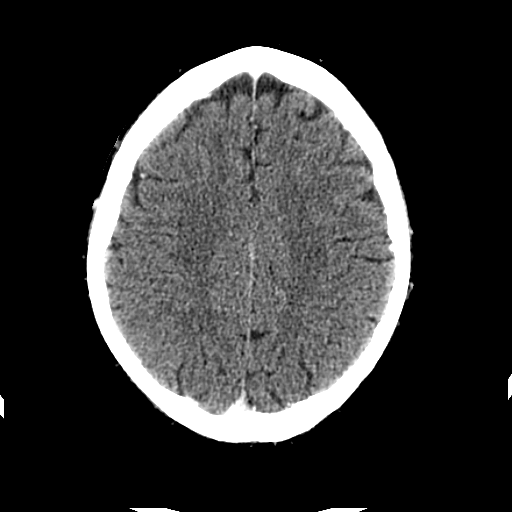
[im 55/71  brain]
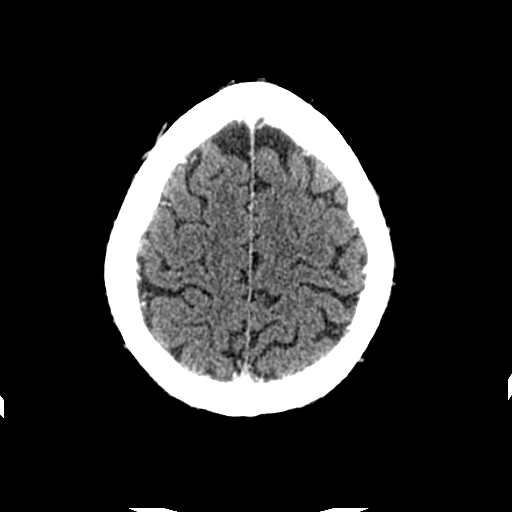
[im 63/71  brain]
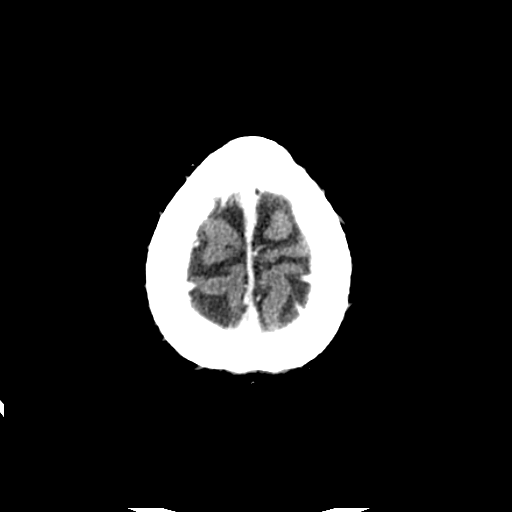

[18 of 30 positions shown; findings below may reference images not displayed]

FINDINGS: There is no evidence of mass, hemorrhage, shift of the midline structures, extra-axial collection, or abnormal enhancement.  The ventricles and CSF spaces appear within normal limits.  

Mild bilateral chronic ethmoid sinusitis is noted incidentally.
IMPRESSION: Normal intracranial examination.

## 2020-04-06 IMAGING — MR MRI CERVICAL SPINE WITHOUT CONTRAST
4 of 5 series · 24 of 48 positions shown · IV contrast (gadolinium)
Comparison: Cervical spine radiographs dated 03/14/2016.

﻿EXAM:  [id number]   MRI CERVICAL SPINE WITHOUT CONTRAST
INDICATION: Chronic neck pain.
TECHNIQUE: Multiplanar, multisequential MRI of the cervical spine was performed without gadolinium contrast.

[Series 5: T2 · sagittal · 3.0mm · 0.75mm/px · 8 of 15 slices shown (1 of 2)]
[im 1/15]
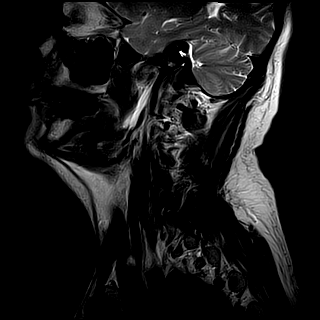
[im 3/15]
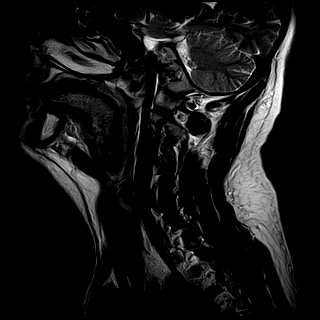
[im 5/15]
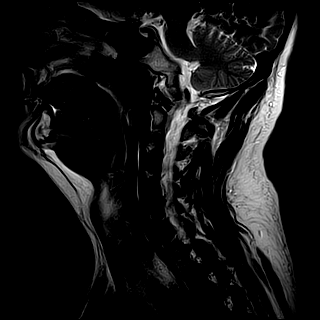
[im 7/15]
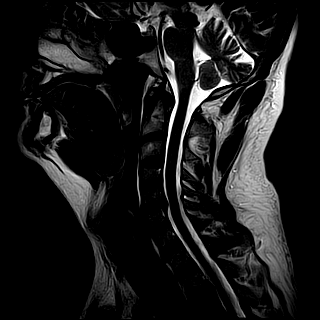
[im 9/15]
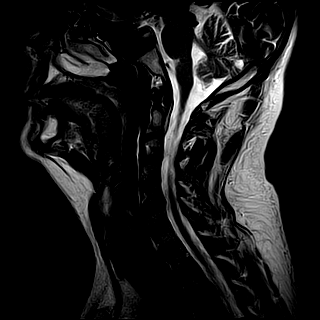
[im 11/15]
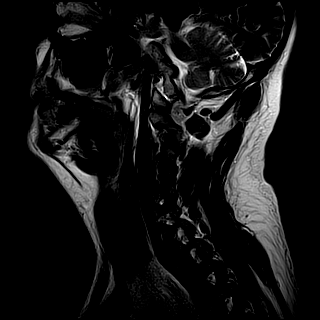
[im 13/15]
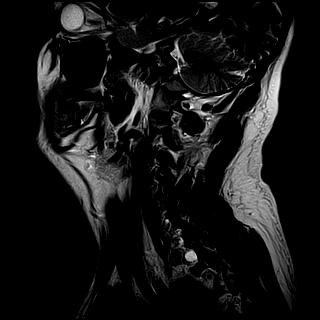
[im 15/15]
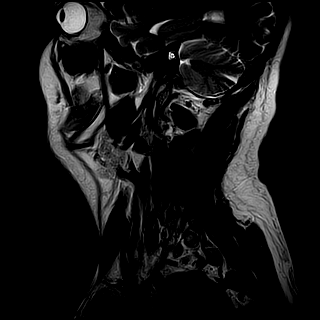

[Series 6: T1 · sagittal · 3.0mm · 0.47mm/px · 3 of 15 slices shown]
[im 2/15]
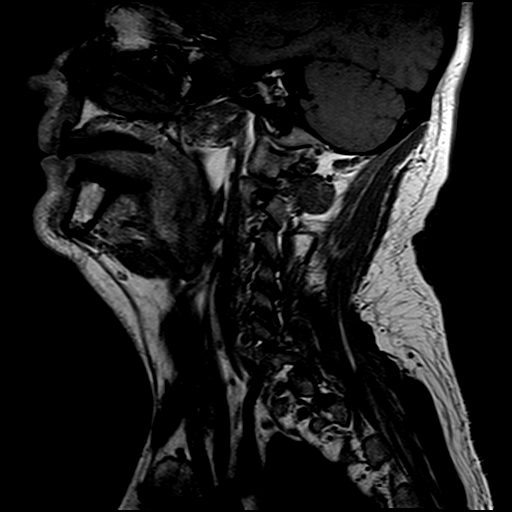
[im 8/15]
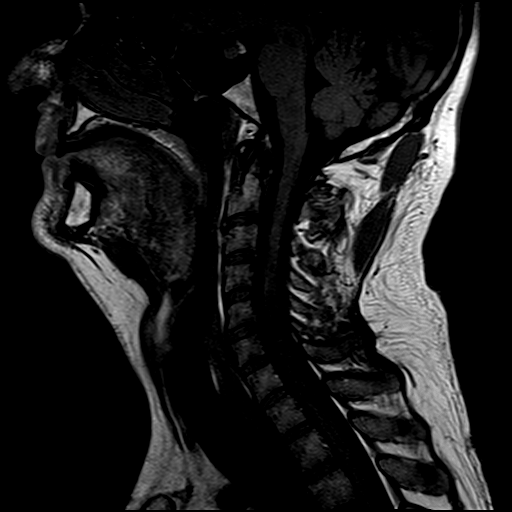
[im 13/15]
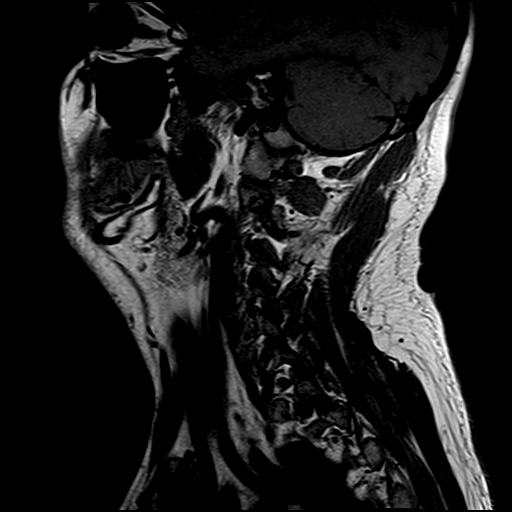

[Series 7: STIR · sagittal · 3.0mm · 0.47mm/px · 3 of 15 slices shown]
[im 2/15]
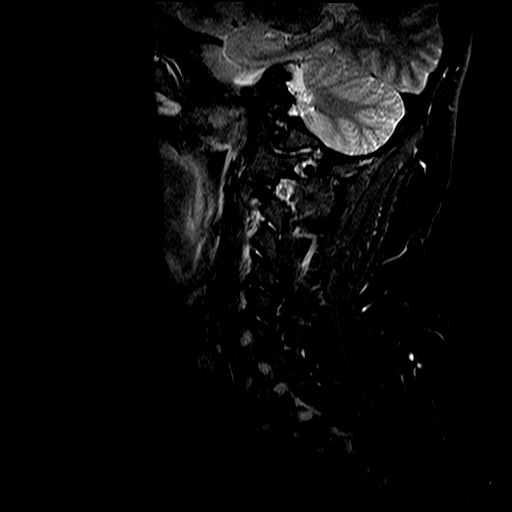
[im 8/15]
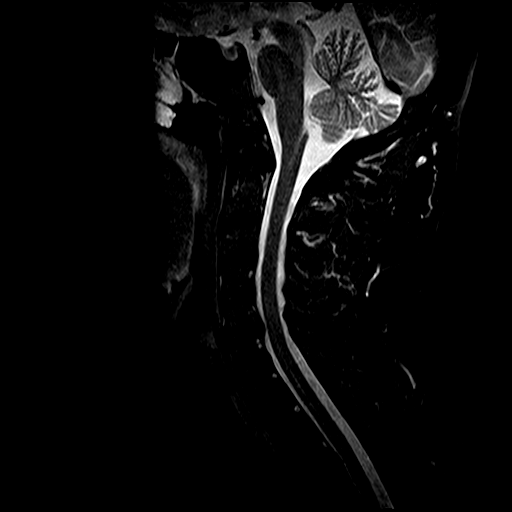
[im 13/15]
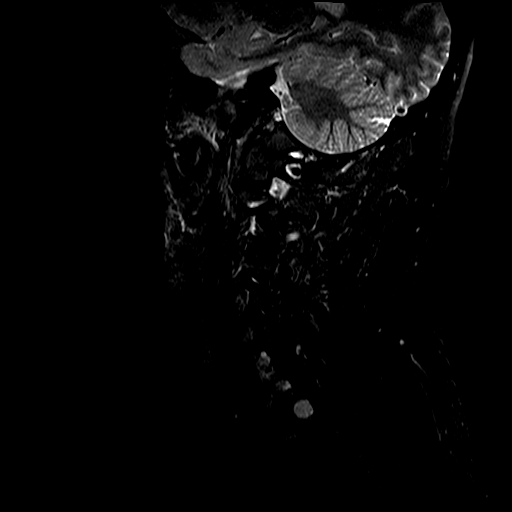

[Series 9: T2 · axial · 3.0mm · 0.39mm/px · z∈[-123,+0]mm · 10 of 18 slices shown (2 of 2)]
[im 1/18]
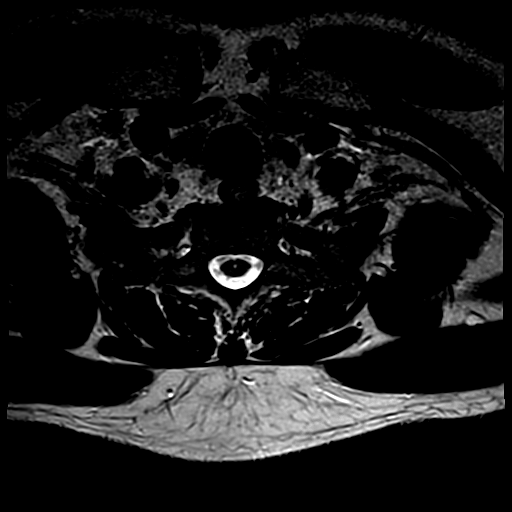
[im 2/18]
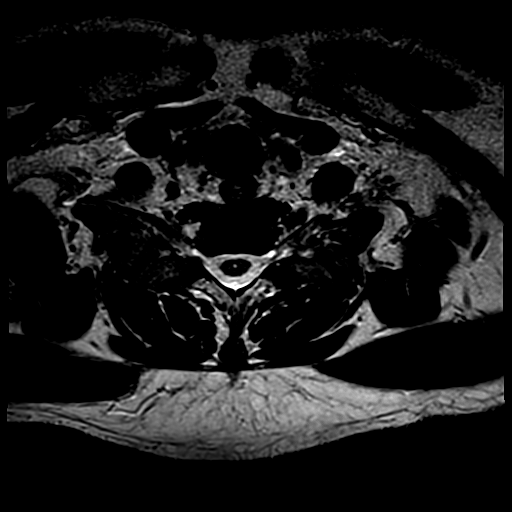
[im 4/18]
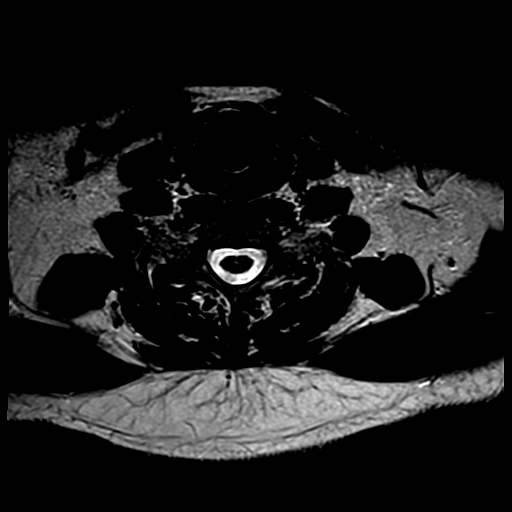
[im 6/18]
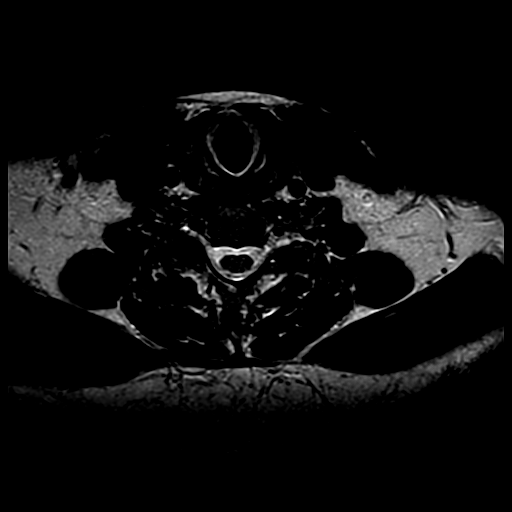
[im 7/18]
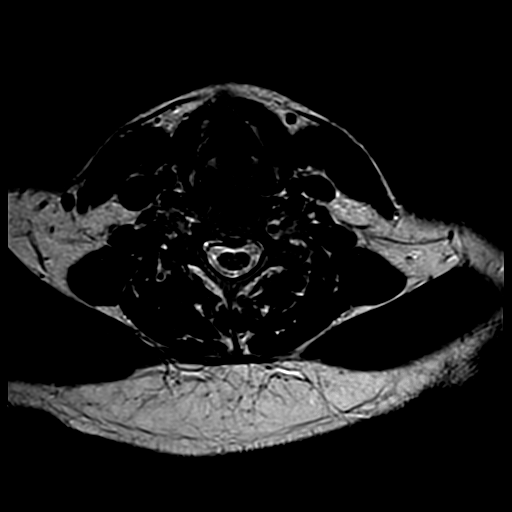
[im 9/18]
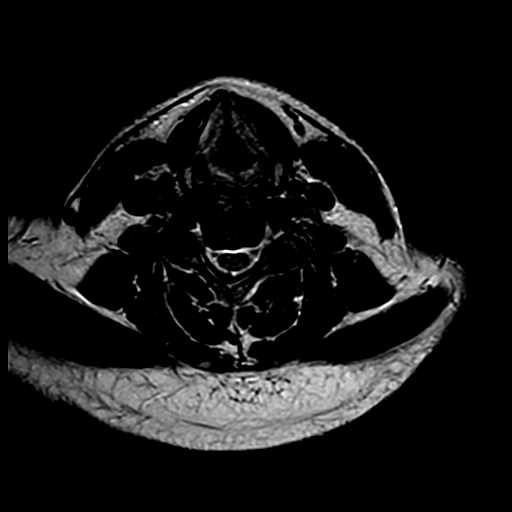
[im 11/18]
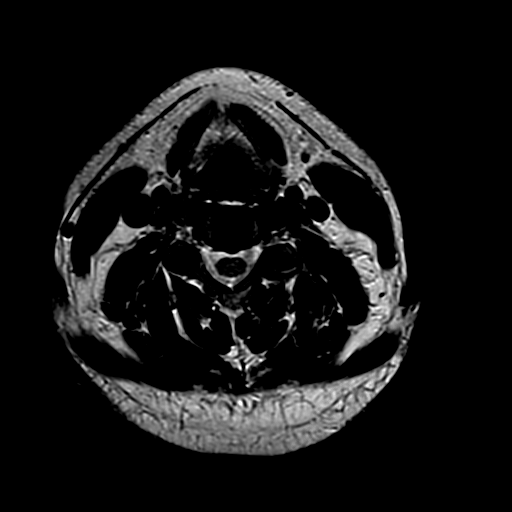
[im 12/18]
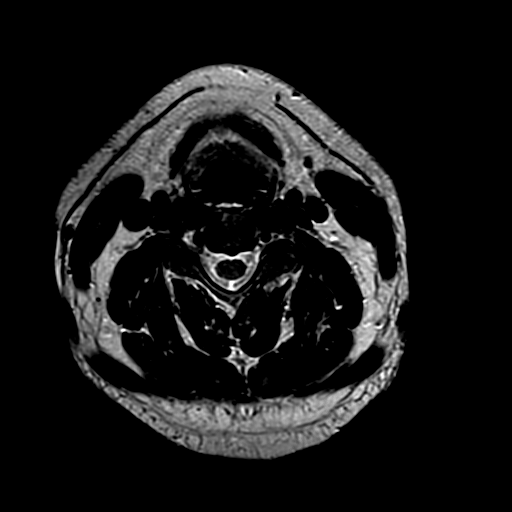
[im 14/18]
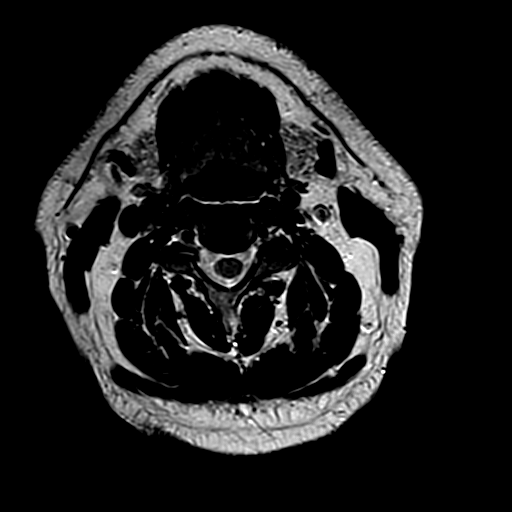
[im 16/18]
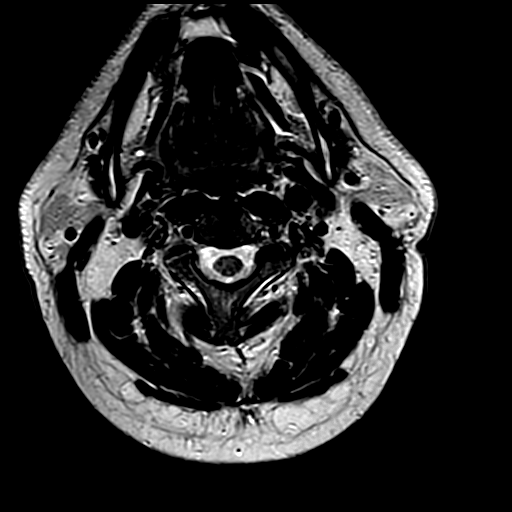

[24 of 48 positions shown; findings below may reference images not displayed]

FINDINGS: Vertebral bodies are normal in height, alignment and signal intensity.  There is no acute fracture or subluxation.  Visualized spinal cord is normal in signal intensity without evidence of compression at any level.

C2-C3, C3-C4 and C4-C5 levels are unremarkable.

At C5-C6 level, there is a small broad-based central disc bulge with near complete effacement of the ventral CSF.  There is moderate to severe right and mild left neural foraminal stenosis from facet and uncovertebral joint hypertrophy.  

C6-C7, C7-T1 levels and paraspinal soft tissues are also unremarkable.
IMPRESSION: Degenerative changes at C5-C6 level as detailed above.

## 2020-11-28 IMAGING — CR XRAY CERVICAL SPINE [DATE] VIEWS
1 series · 4 of 4 positions shown · non-contrast
Comparison: MRI dated 04/06/2020.

﻿EXAM:  [id number]      XRAY CERVICAL SPINE [DATE] VIEWS
INDICATION: Neck pain, history of surgery.

[Series 1: view not recorded · 0.17mm/px · 4 of 4 slices shown]
[im 1/4]
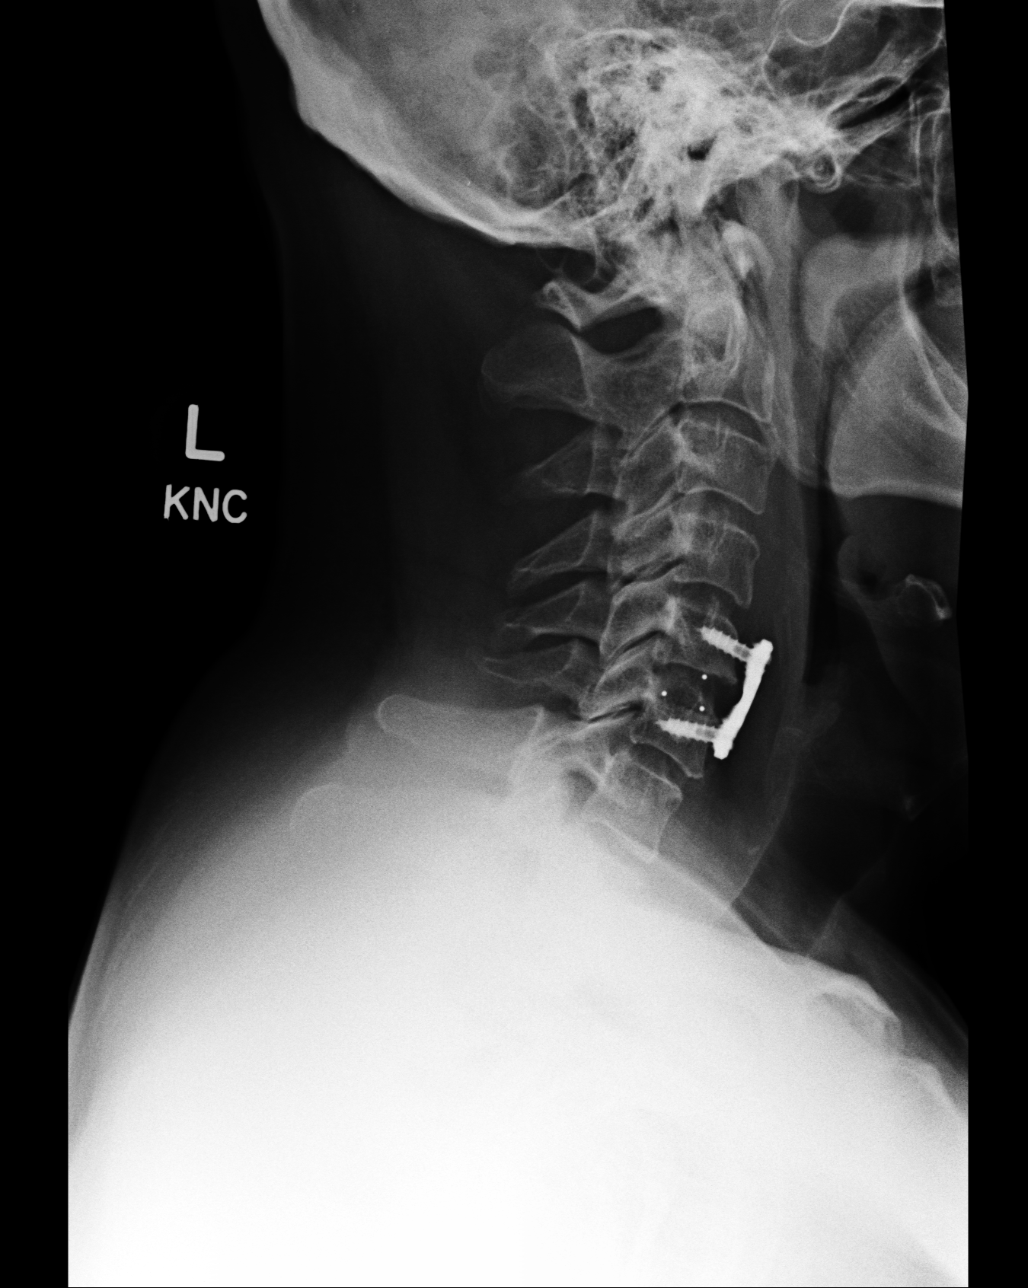
[im 2/4]
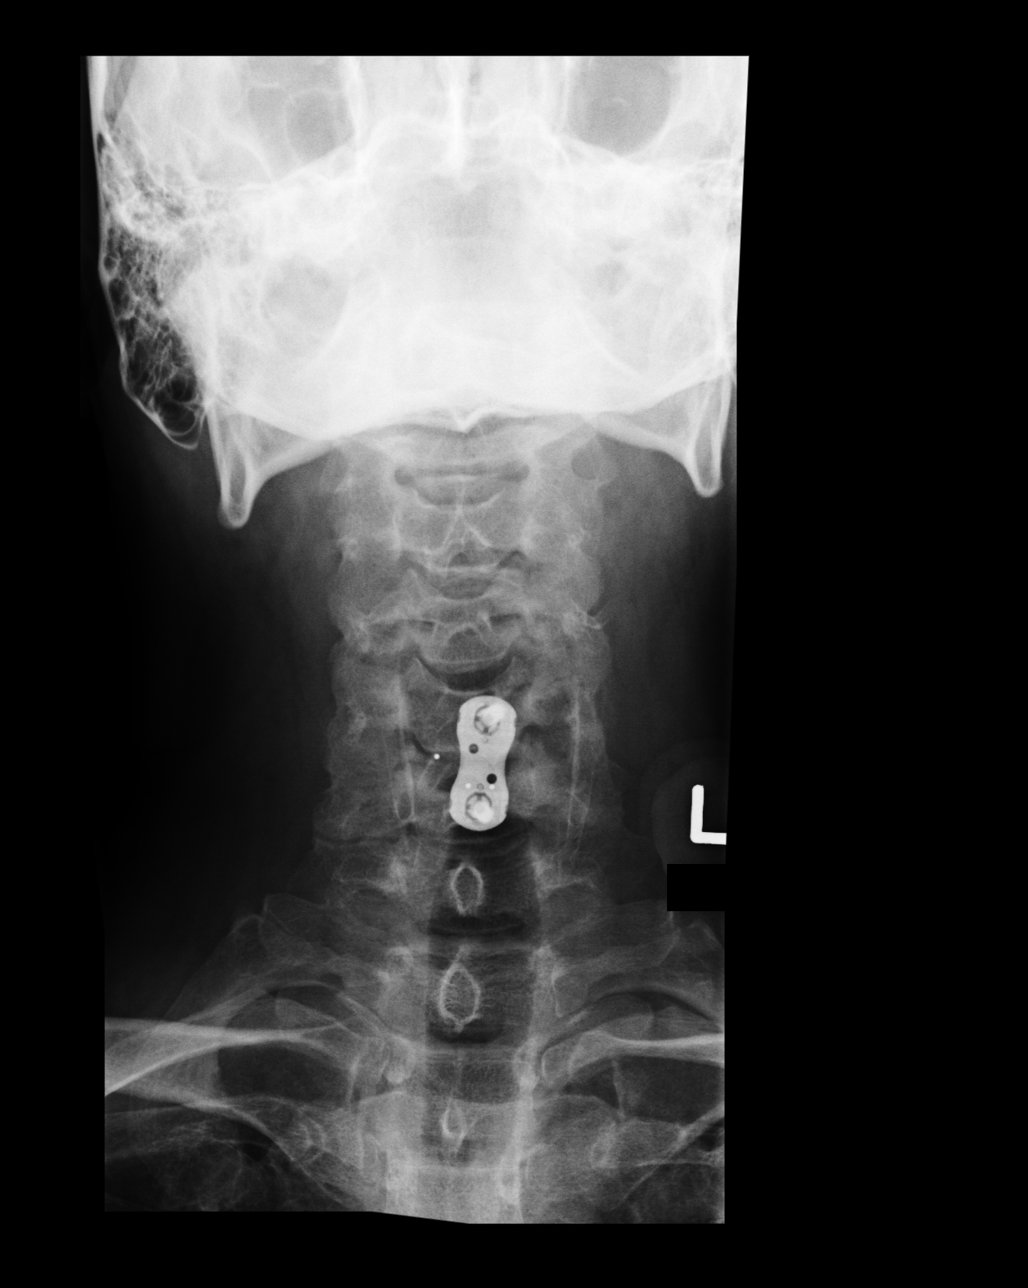
[im 3/4]
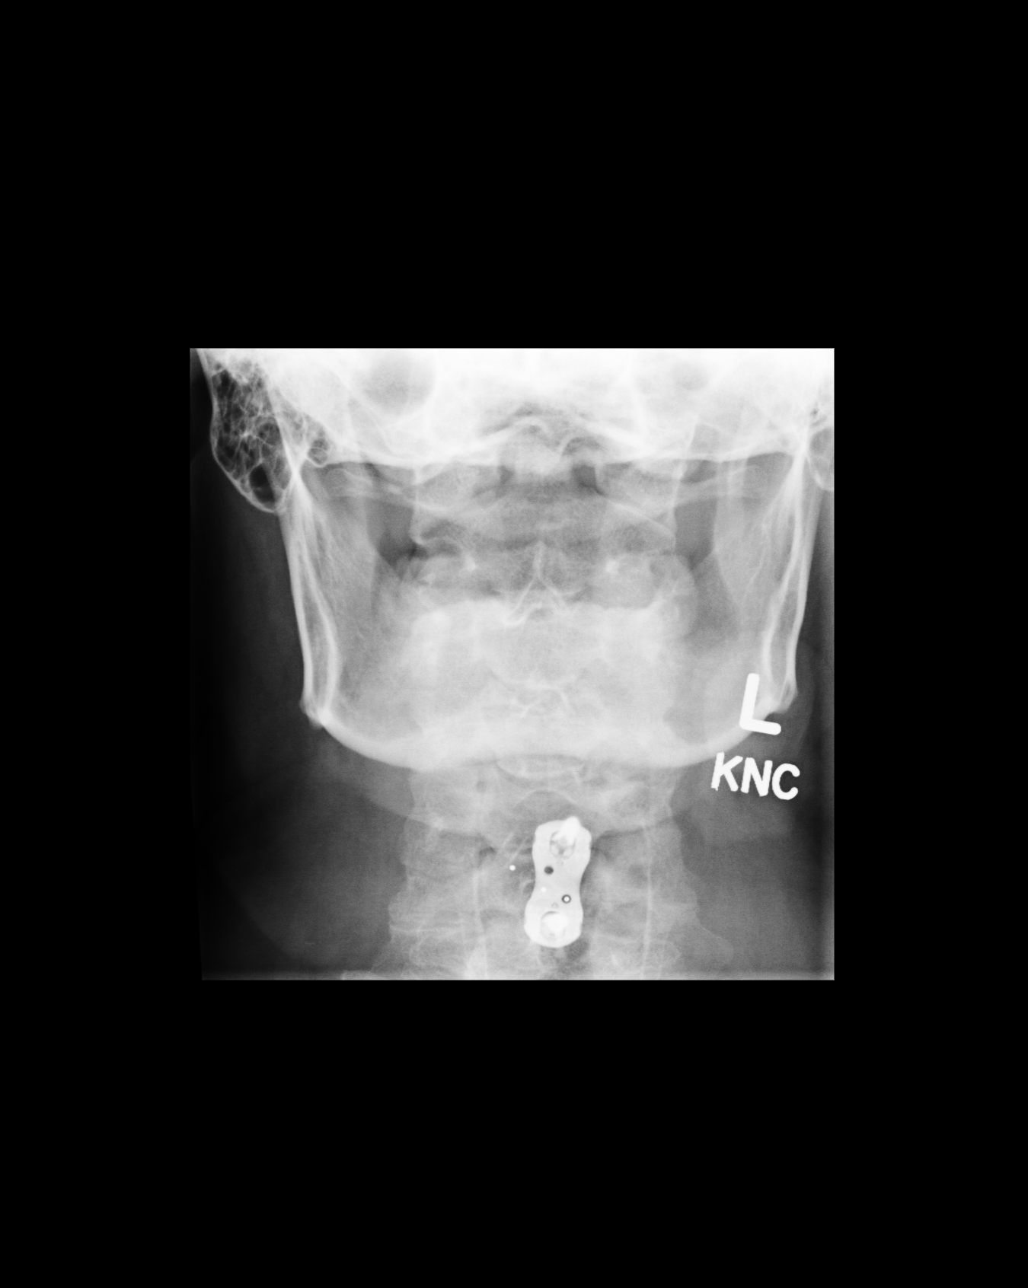
[im 4/4]
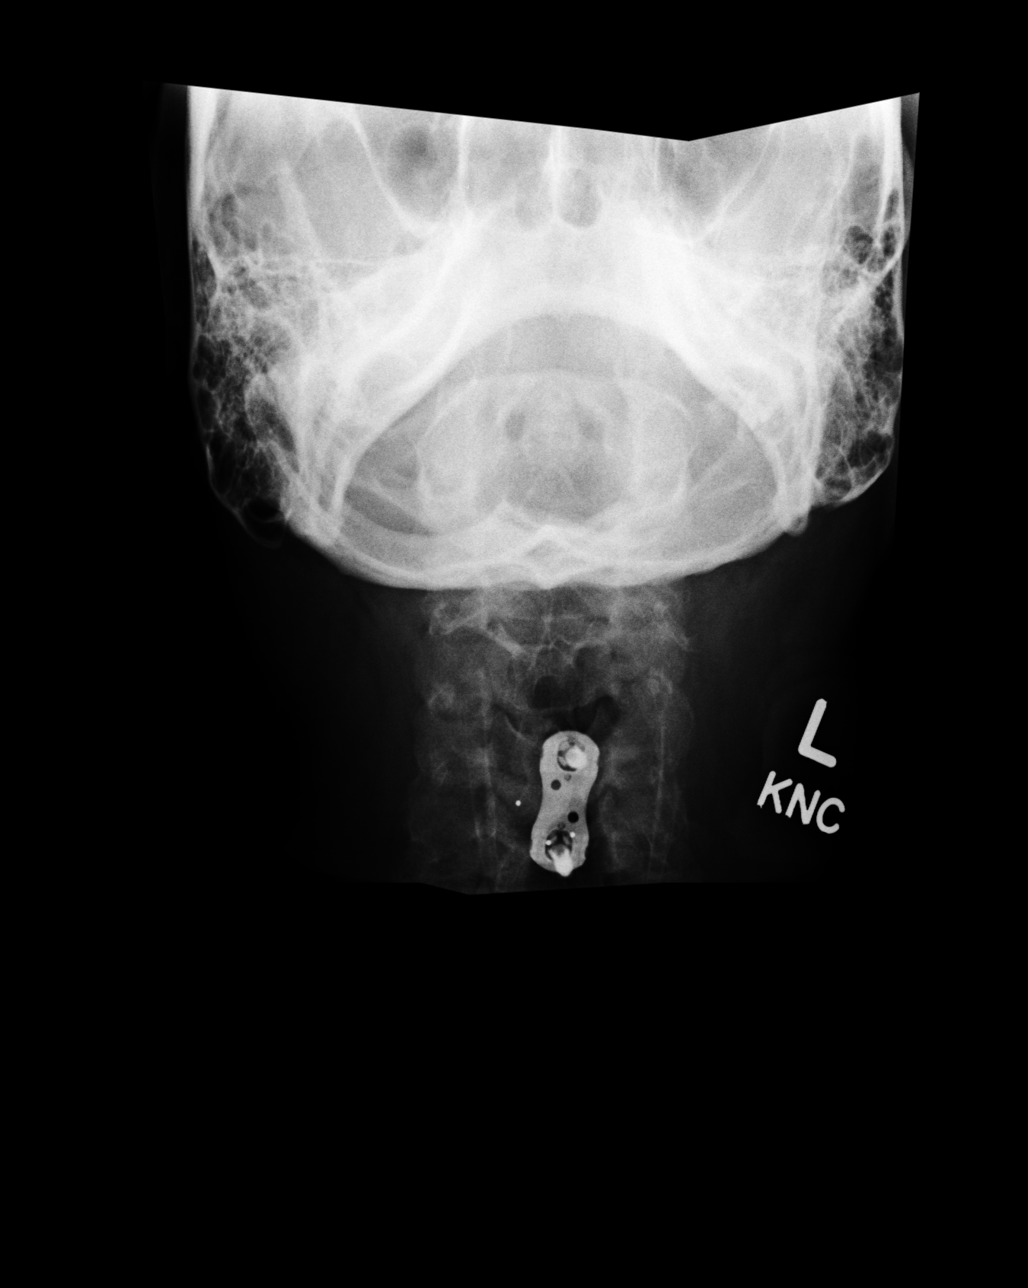

[4 of 4 positions shown; findings below may reference images not displayed]

FINDINGS: There is no acute fracture or subluxation. Patient has undergone anterior fusion of C5 and C6 vertebral bodies with metallic plate and screws since the previous exam. Interbody fusion device is also seen at this level. Atlantoaxial articulation is well maintained. Remaining disc spaces appear unremarkable. There is no prevertebral soft tissue swelling.
IMPRESSION: New anterior fusion of C5 and C6 vertebral bodies as detailed above.

## 2021-01-03 IMAGING — MR MRI LUMBAR SPINE WITHOUT AND WITH CONTRAST
8 series · 48 of 48 positions shown · IV contrast (gadavist)
Comparison: Radiographs dated 03/14/2016.

﻿EXAM:  [id number]   MRI LUMBAR SPINE WITHOUT AND WITH CONTRAST
INDICATION: Lower back pain with left lower extremity radiculopathy. History of remote back surgery.
TECHNIQUE: Multiplanar multisequential MRI of the lumbar spine was performed without and with 5 mL of Gadavist.

[Series 5: T2 · sagittal · 4.0mm · 0.94mm/px · 5 of 13 slices shown (1 of 3)]
[im 1/13]
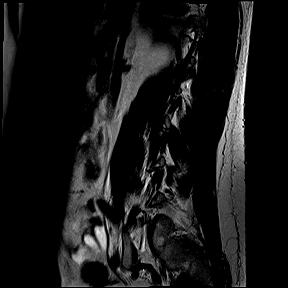
[im 4/13]
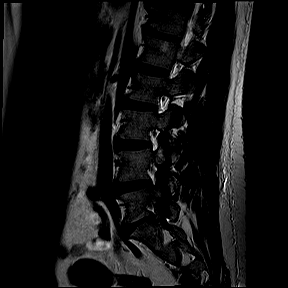
[im 7/13]
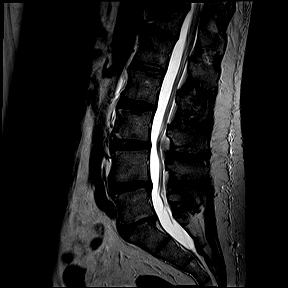
[im 10/13]
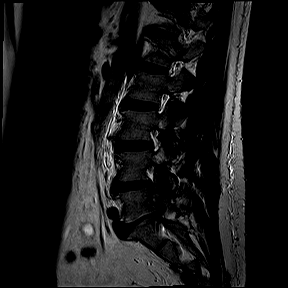
[im 13/13]
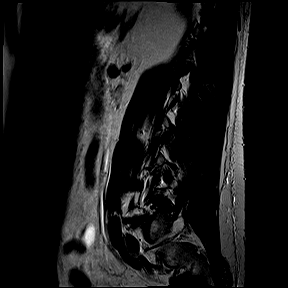

[Series 6: T1 · sagittal · 4.0mm · 0.94mm/px · 5 of 13 slices shown]
[im 1/13]
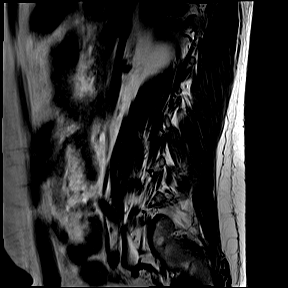
[im 4/13]
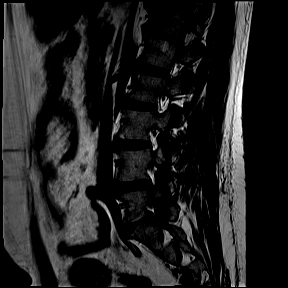
[im 7/13]
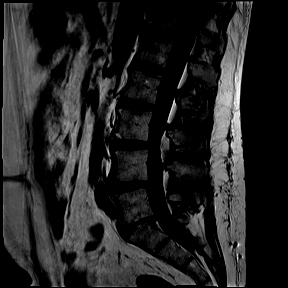
[im 10/13]
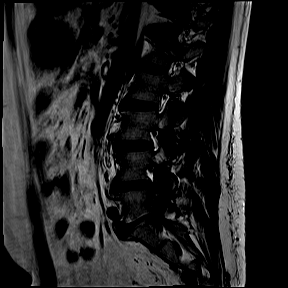
[im 13/13]
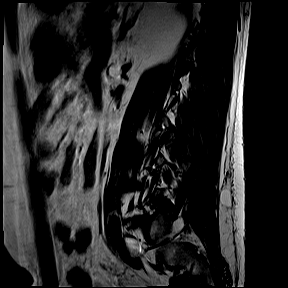

[Series 7: T1 fat-sat · sagittal · 4.0mm · 1.05mm/px · 5 of 13 slices shown]
[im 1/13]
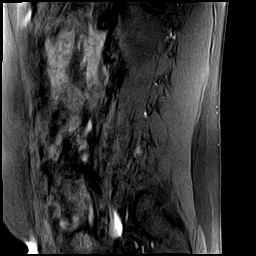
[im 4/13]
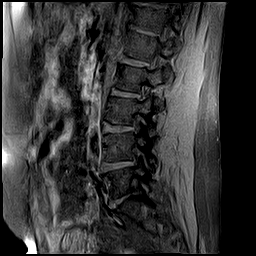
[im 7/13]
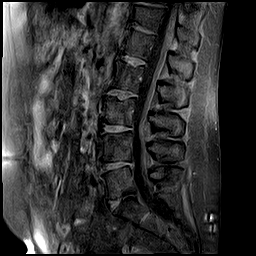
[im 10/13]
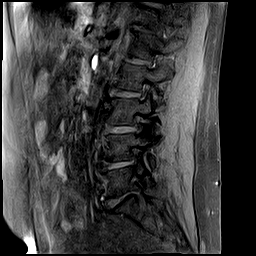
[im 13/13]
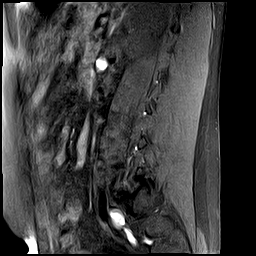

[Series 8: STIR · sagittal · 4.0mm · 1.05mm/px · 5 of 13 slices shown]
[im 1/13]
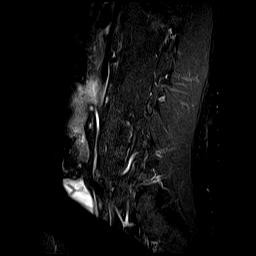
[im 4/13]
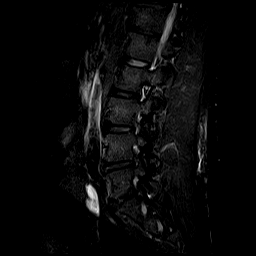
[im 7/13]
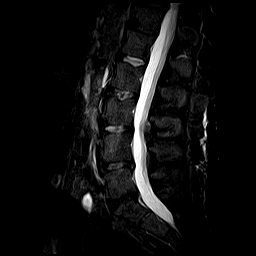
[im 10/13]
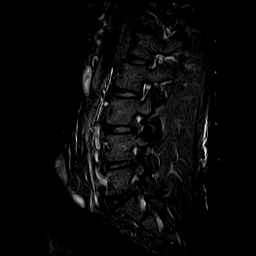
[im 13/13]
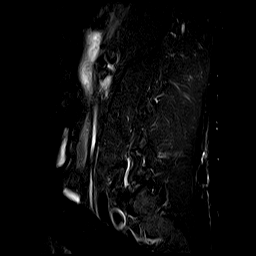

[Series 9: T2 · coronal · 4.0mm · 0.90mm/px · 7 of 20 slices shown (2 of 3)]
[im 1/20]
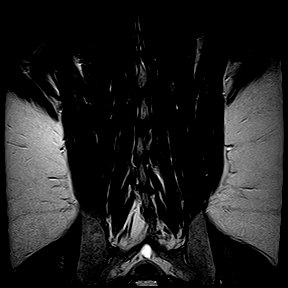
[im 4/20]
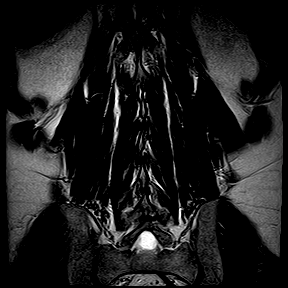
[im 7/20]
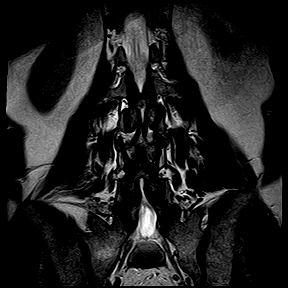
[im 10/20]
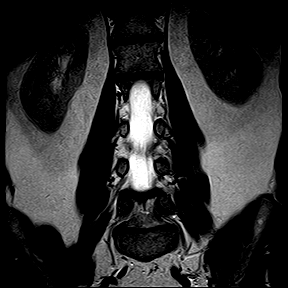
[im 13/20]
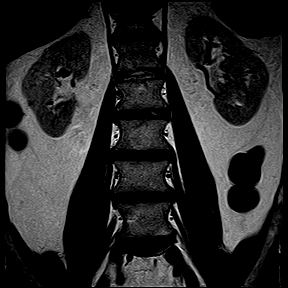
[im 16/20]
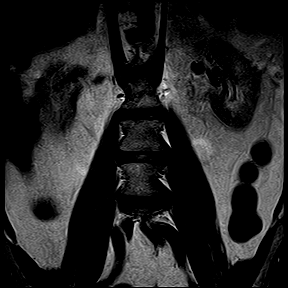
[im 20/20]
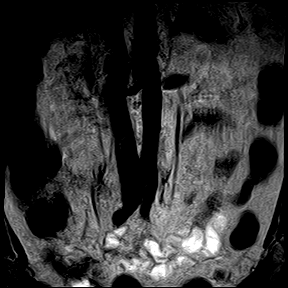

[Series 10: T2 · axial · 4.0mm · 0.47mm/px · z∈[-113,+98]mm · 8 of 23 slices shown (3 of 3)]
[im 1/23]
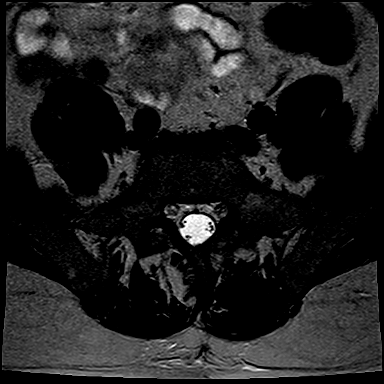
[im 4/23]
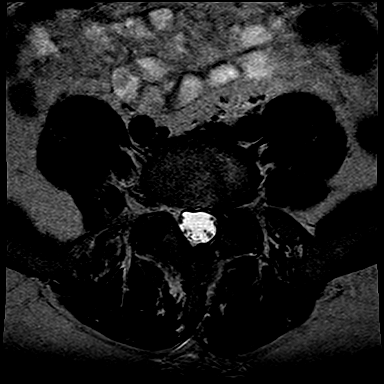
[im 7/23]
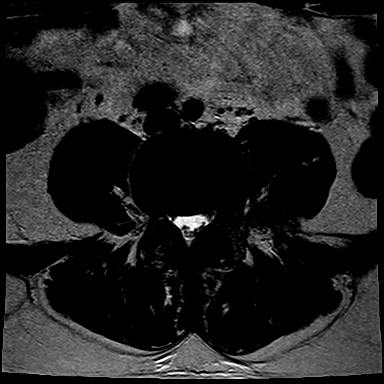
[im 10/23]
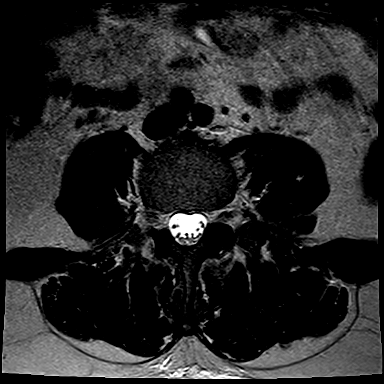
[im 13/23]
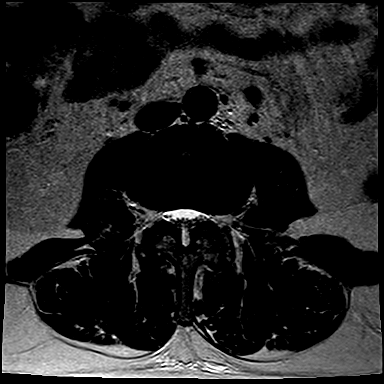
[im 16/23]
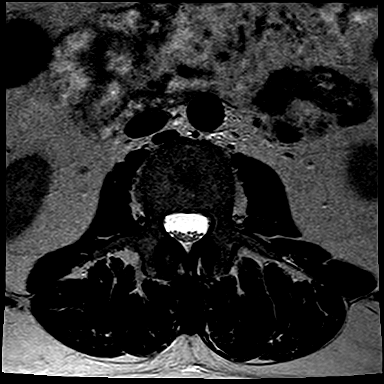
[im 19/23]
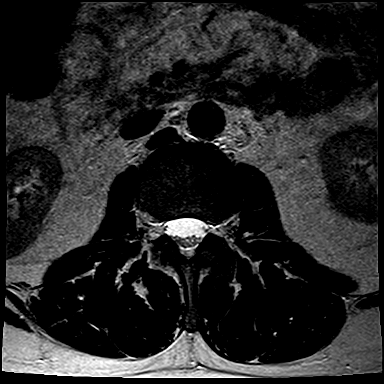
[im 23/23]
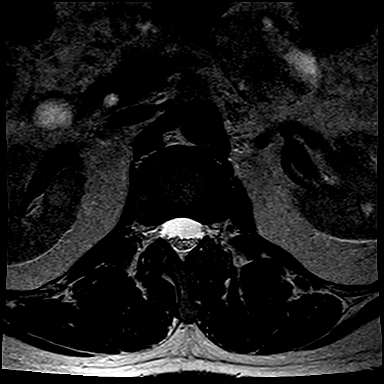

[Series 12: T1 fat-sat post-contrast · sagittal · 4.0mm · 1.05mm/px · 5 of 13 slices shown (1 of 2)]
[im 1/13]
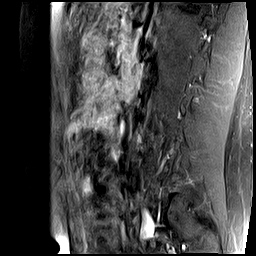
[im 4/13]
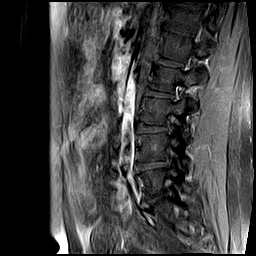
[im 7/13]
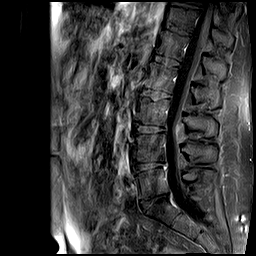
[im 10/13]
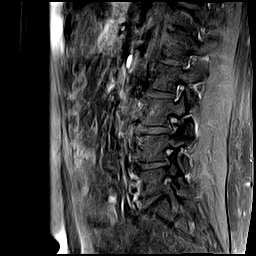
[im 13/13]
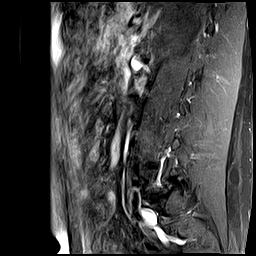

[Series 13: T1 fat-sat post-contrast · axial · 4.0mm · 0.70mm/px · z∈[-113,+98]mm · 8 of 23 slices shown (2 of 2)]
[im 1/23]
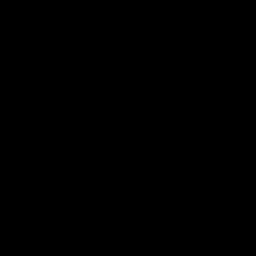
[im 4/23]
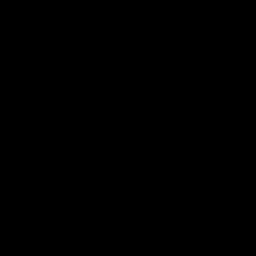
[im 7/23]
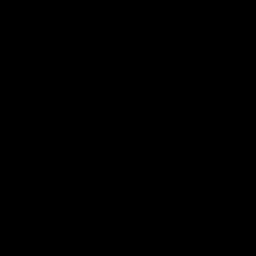
[im 10/23]
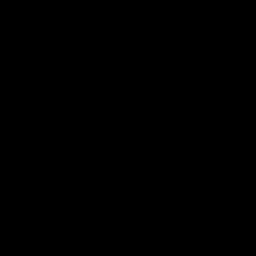
[im 13/23]
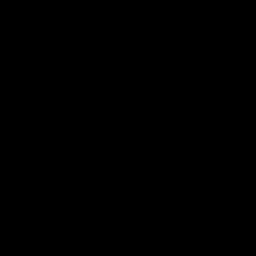
[im 16/23]
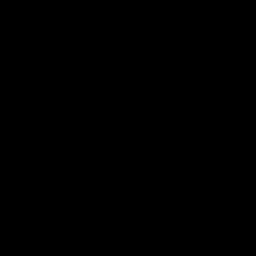
[im 19/23]
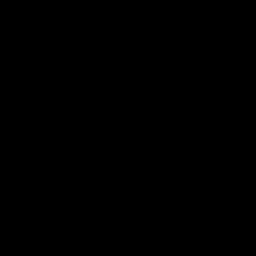
[im 23/23]
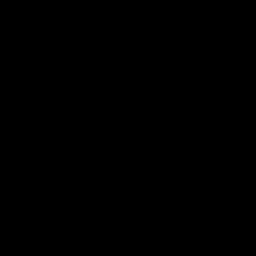

[48 of 48 positions shown; findings below may reference images not displayed]

FINDINGS: Bone marrow signal intensity is normal. There is no acute fracture or subluxation. Distal spinal cord is normal in signal intensity and terminates normally at T12-L1 disc space level. Spinal canal is congenitally narrow.

L1-2 and L2-3 levels are unremarkable.

At L3-4 level, there is a small broad-based central disc bulge mildly effacing ventral thecal sac. There is moderate left and mild right neural foraminal stenosis from facet arthropathy and bulging annulus.

At L4-5 level, there is minimal retrolisthesis of L4 on L5 vertebral body. There is a small broad-based central disc bulge, mildly effacing the ventral thecal sac. There is mild-to-moderate left and mild right neural foraminal stenosis from facet arthropathy and bulging annulus.

At L5-S1 level, there is marked disc desiccation. There is no significant disc herniation. There is suggestion of a left hemilaminectomy defect at this level. There is severe left and moderate right neural foraminal stenosis from facet arthropathy and bulging annulus.

Following intravenous contrast administration, there is nonspecific faint patchy dorsal epidural soft tissue enhancement at the level of L3-4 disc space. No abnormal nerve root enhancement is noted.
IMPRESSION: 1. Suggestion of a left hemilaminectomy defect at L5-S1 level. 

2. No significant disc herniation or spinal stenosis at any level. 

3. Multilevel neural foraminal stenosis as detailed above. 

4. Nonspecific faint patchy dorsal epidural soft tissue enhancement at the level of L3-4 disc space. Follow-up and clinical correlation is advised. Repeat imaging in 6-8 weeks may be helpful for better assessment if clinically indicated.

## 2021-05-27 ENCOUNTER — Other Ambulatory Visit (HOSPITAL_COMMUNITY): Payer: Self-pay | Admitting: INTERNAL MEDICINE

## 2021-05-27 ENCOUNTER — Inpatient Hospital Stay
Admission: RE | Admit: 2021-05-27 | Discharge: 2021-05-27 | Disposition: A | Discharge status: Home / Self Care | Payer: MEDICAID | Source: Ambulatory Visit | Attending: INTERNAL MEDICINE | Admitting: INTERNAL MEDICINE

## 2021-05-27 ENCOUNTER — Other Ambulatory Visit: Payer: Self-pay

## 2021-05-27 DIAGNOSIS — M25569 Pain in unspecified knee: Secondary | ICD-10-CM

## 2021-05-27 DIAGNOSIS — M25562 Pain in left knee: Secondary | ICD-10-CM | POA: Insufficient documentation

## 2021-05-27 DIAGNOSIS — M79606 Pain in leg, unspecified: Secondary | ICD-10-CM

## 2021-05-27 DIAGNOSIS — M25579 Pain in unspecified ankle and joints of unspecified foot: Secondary | ICD-10-CM

## 2021-05-27 DIAGNOSIS — M25572 Pain in left ankle and joints of left foot: Secondary | ICD-10-CM | POA: Insufficient documentation

## 2021-05-27 DIAGNOSIS — M79605 Pain in left leg: Secondary | ICD-10-CM | POA: Insufficient documentation

## 2021-07-29 IMAGING — DX XRAY SHOULDER MINIMUM 2 VIEW LT
1 series · 2 of 2 positions shown · non-contrast
Comparison: None previous.

﻿EXAM:  [id number]   XRAY SHOULDER MINIMUM 2 VIEW LT
INDICATION: Left shoulder pain.  No mention of trauma.

[Series 1: apobl · 0.14mm/px · 2 of 2 slices shown]
[im 1/2]
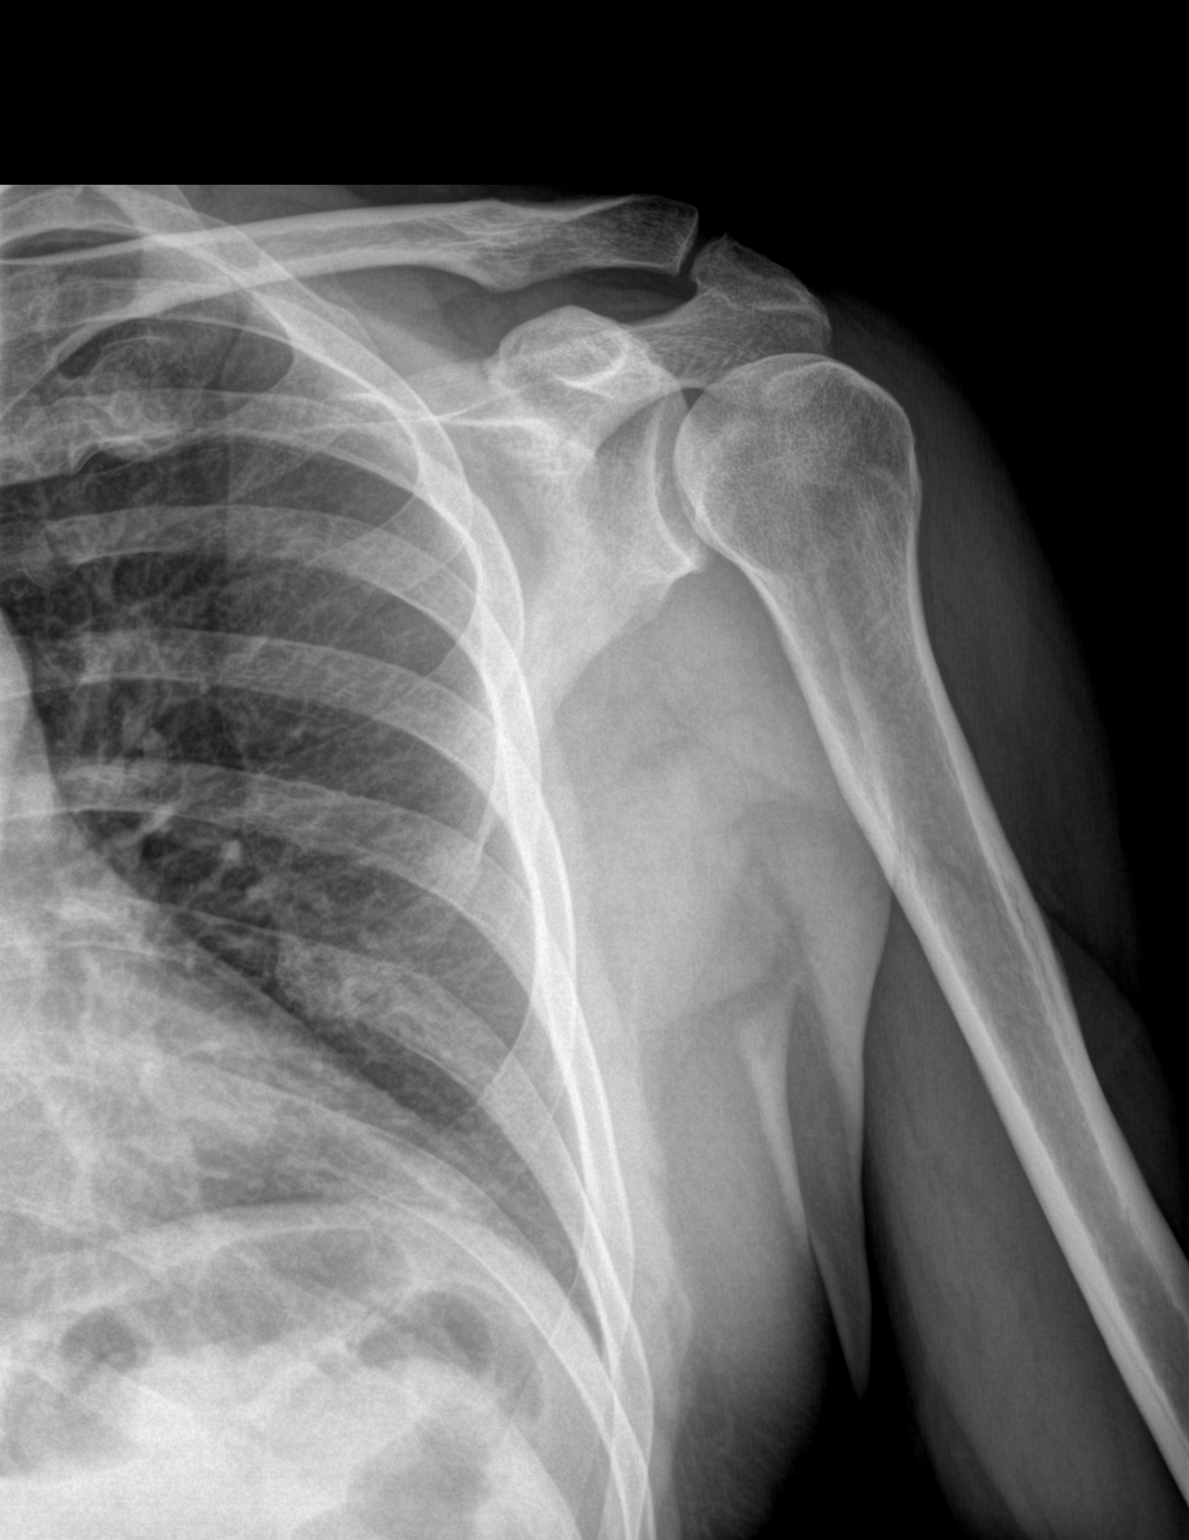
[im 2/2]
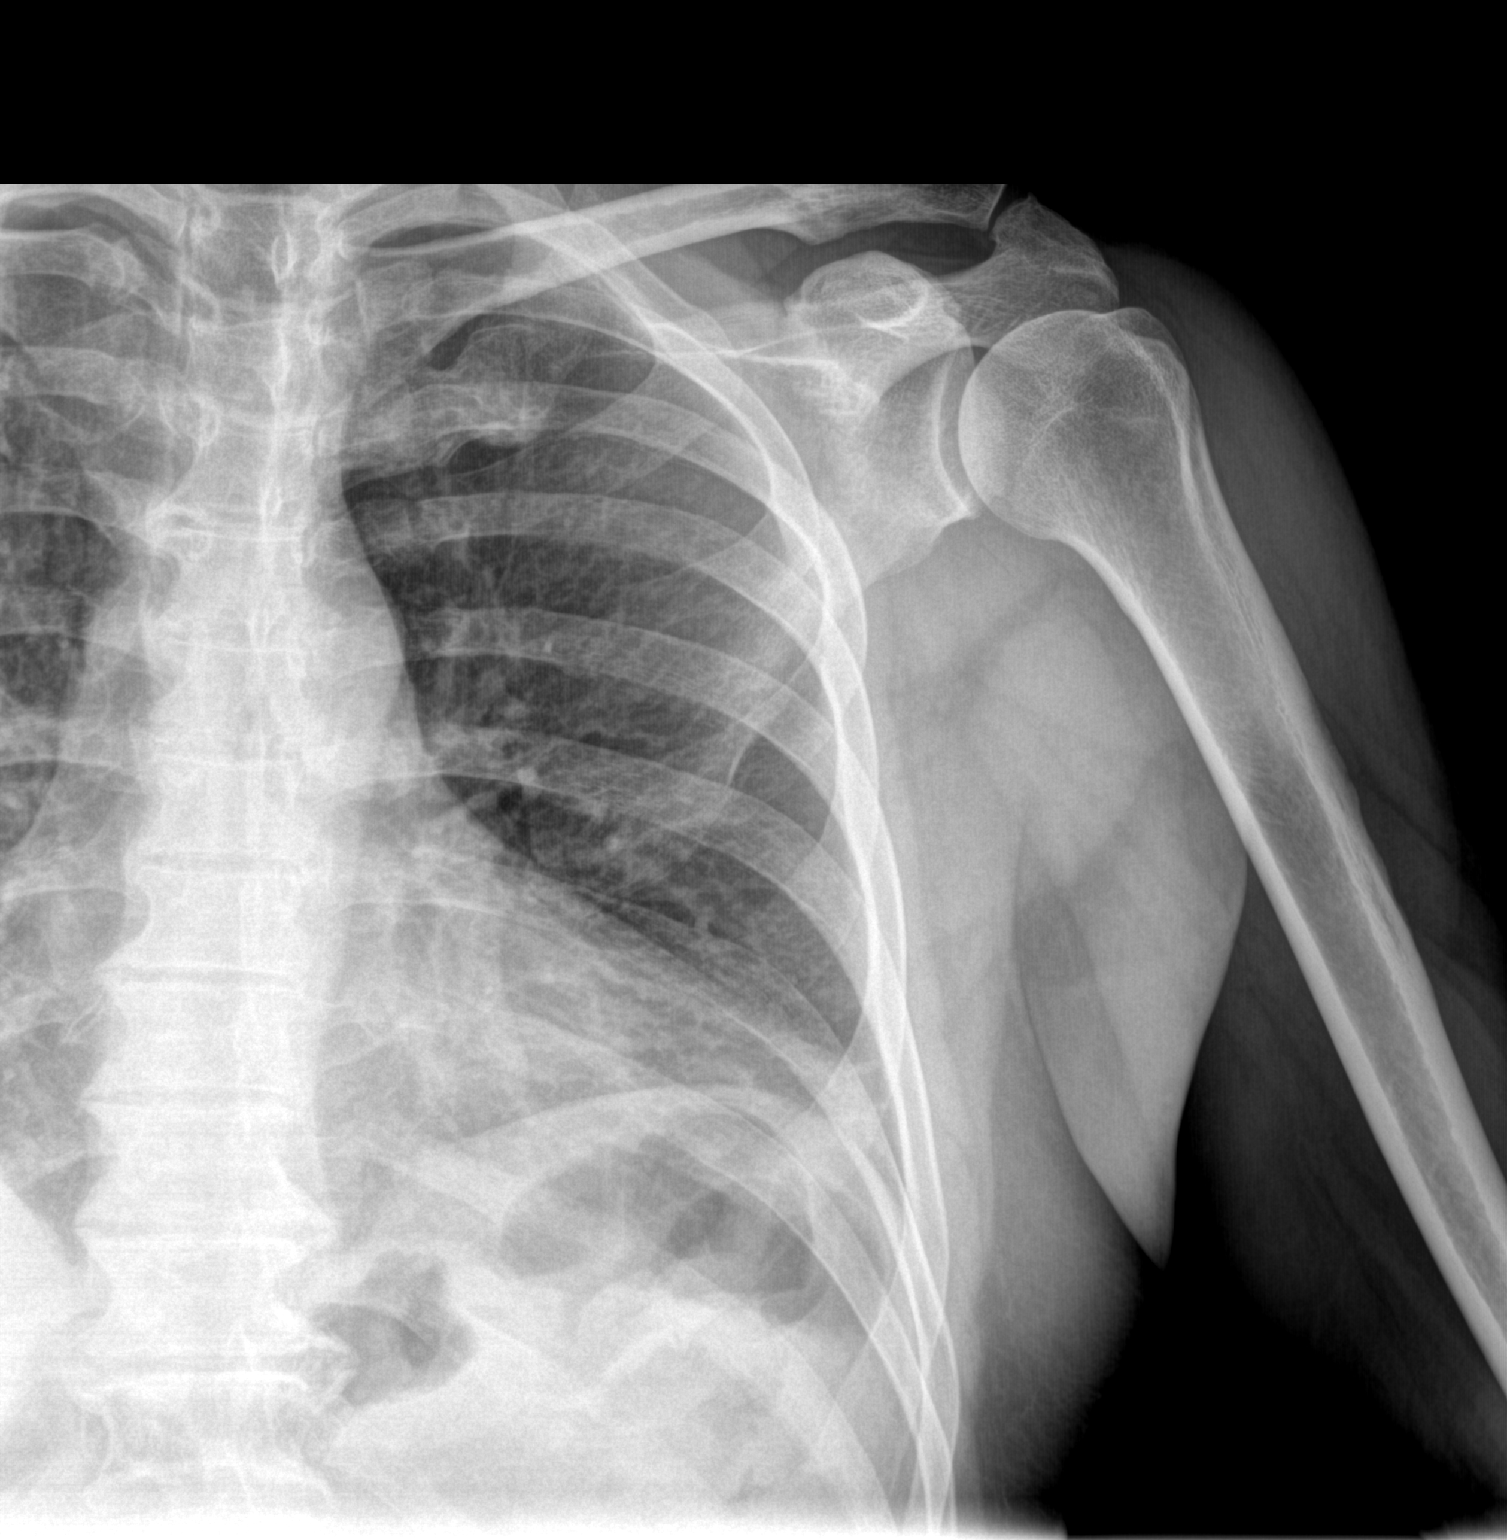

[2 of 2 positions shown; findings below may reference images not displayed]

FINDINGS: No acute bone changes are seen.  Joint spaces are normal.  Soft tissues are normal.
IMPRESSION: No plain radiographic abnormalities at the left shoulder.  If symptoms are significant, persistent and not responding to conservative treatment, further evaluation with MRI is suggested.

## 2021-08-13 IMAGING — MR MRI SHOULDER LT W/O CONTRAST
6 series · 39 of 40 positions shown · non-contrast
Comparison: None available.

﻿EXAM:  [id number]   MRI SHOULDER LT W/O CONTRAST
INDICATION: Left shoulder pain, limited range of motion.
TECHNIQUE: Noncontrast multiplanar, multisequence MRI was performed.

[Series 6: T1 · oblique · left · 3.5mm · 0.33mm/px · 7 of 18 slices shown]
[im 1/18]
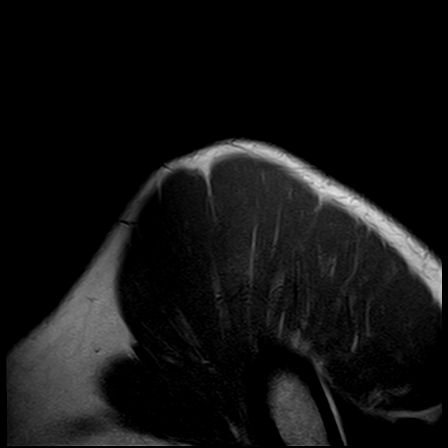
[im 3/18]
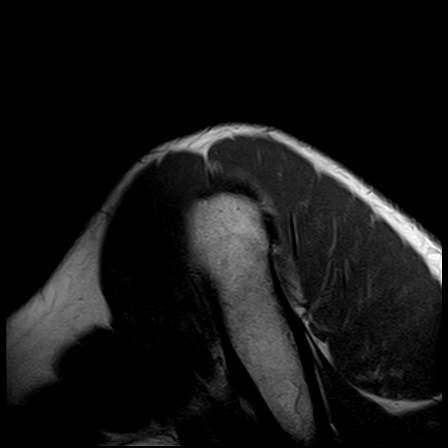
[im 6/18]
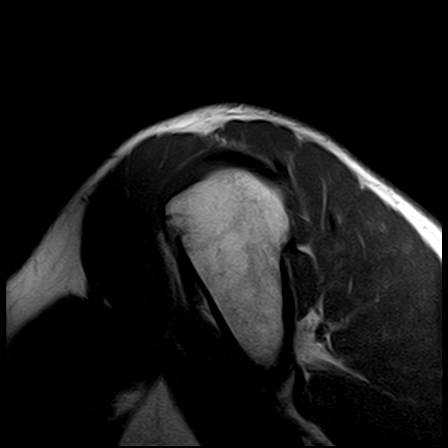
[im 9/18]
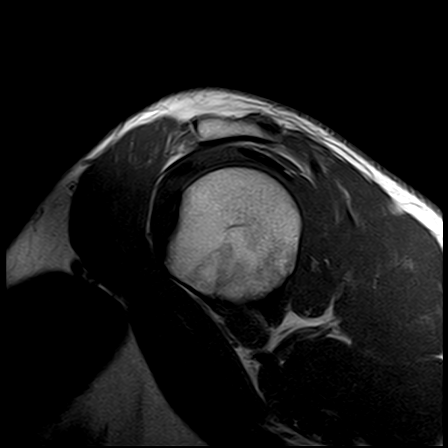
[im 12/18]
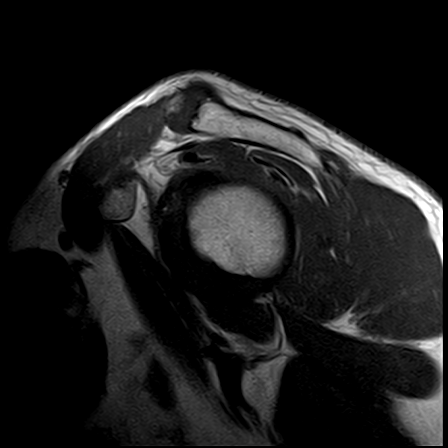
[im 15/18]
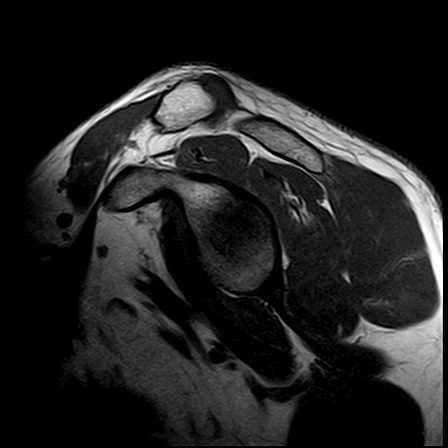
[im 18/18]
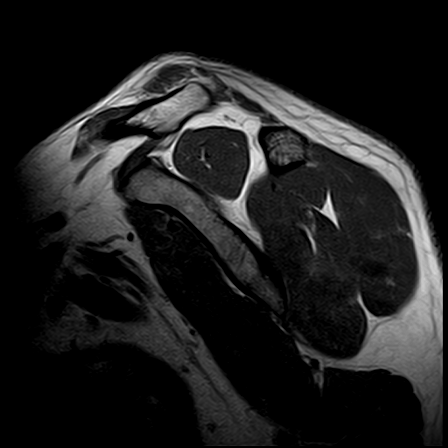

[Series 7: STIR · oblique · left · 3.5mm · 0.47mm/px · 7 of 18 slices shown (1 of 2)]
[im 1/18]
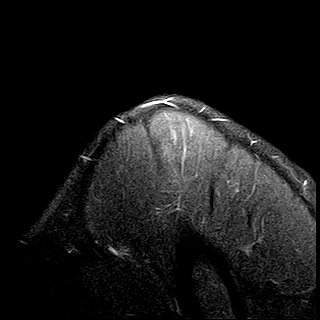
[im 3/18]
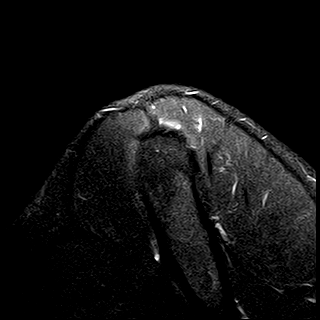
[im 6/18]
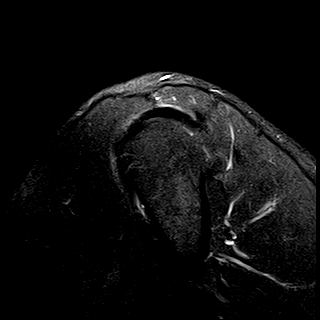
[im 9/18]
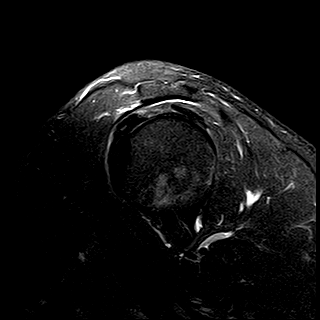
[im 12/18]
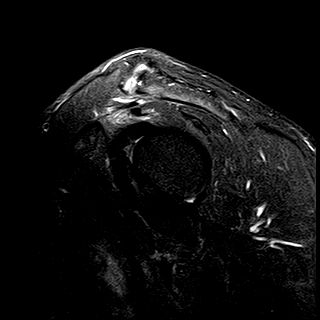
[im 15/18]
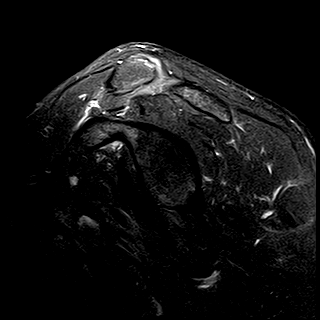
[im 18/18]
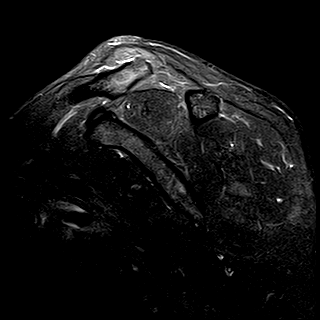

[Series 8: PD fat-sat · axial · left · 4.0mm · 0.50mm/px · z∈[-35,+42]mm · 6 of 18 slices shown (1 of 2)]
[im 1/18]
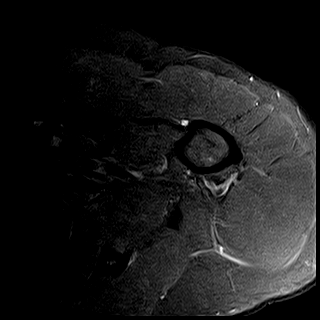
[im 4/18]
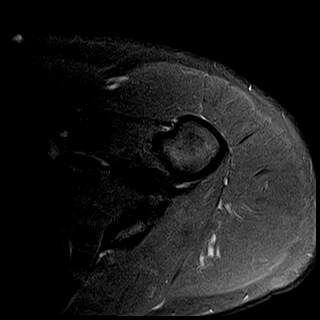
[im 7/18]
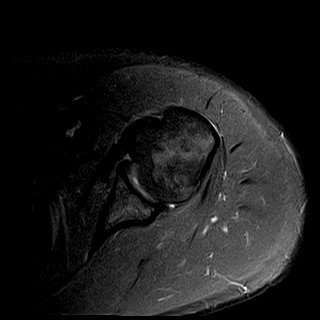
[im 11/18]
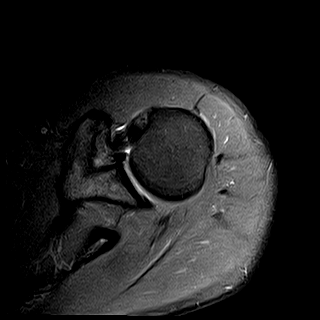
[im 14/18]
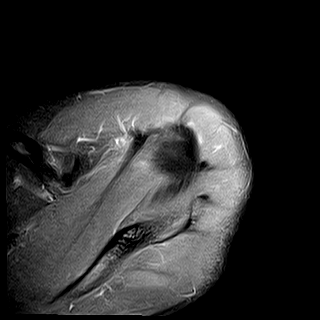
[im 18/18]
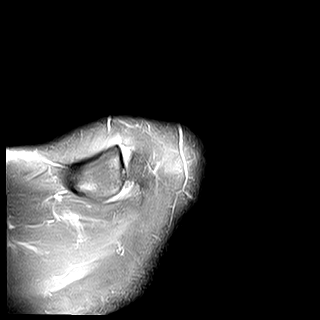

[Series 9: T2 fat-sat · axial · left · 4.0mm · 0.42mm/px · z∈[-48,+55]mm · 8 of 24 slices shown]
[im 1/24]
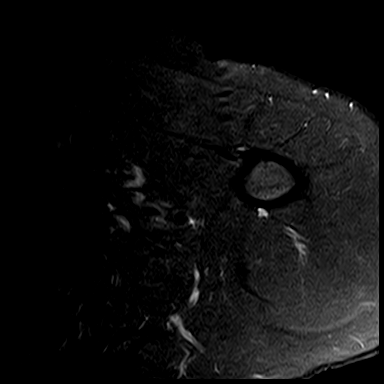
[im 4/24]
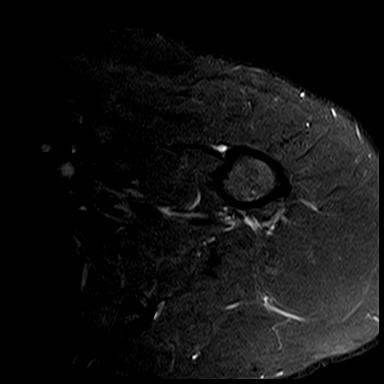
[im 7/24]
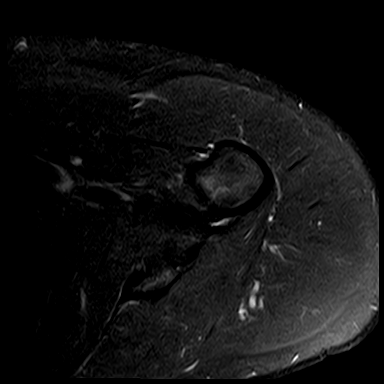
[im 10/24]
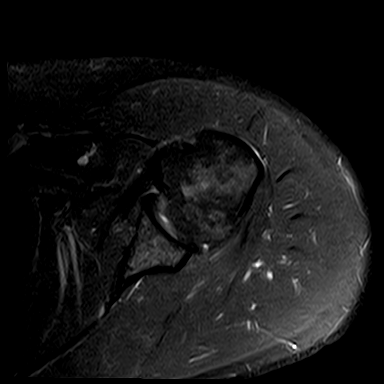
[im 14/24]
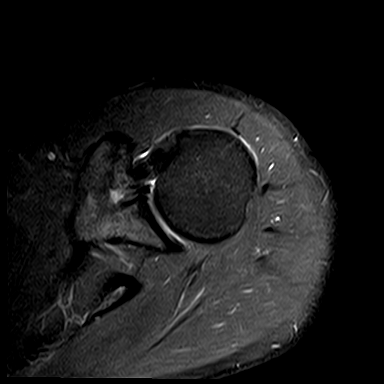
[im 17/24]
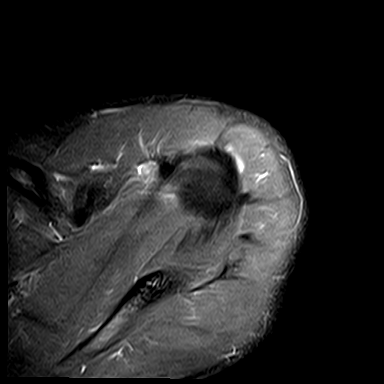
[im 20/24]
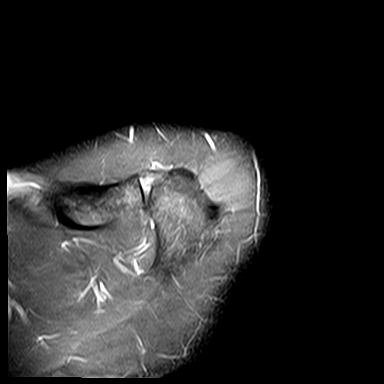
[im 24/24]
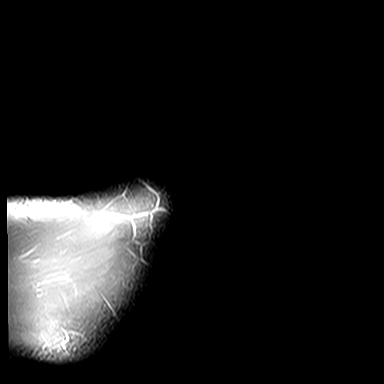

[Series 10: PD fat-sat · oblique · left · 3.5mm · 0.47mm/px · 6 of 18 slices shown (2 of 2)]
[im 1/18]
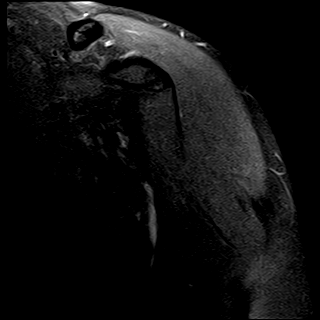
[im 4/18]
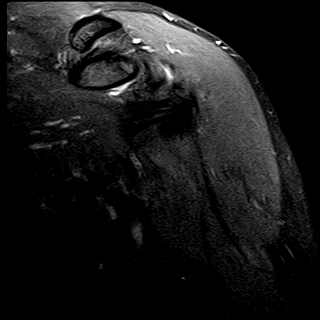
[im 7/18]
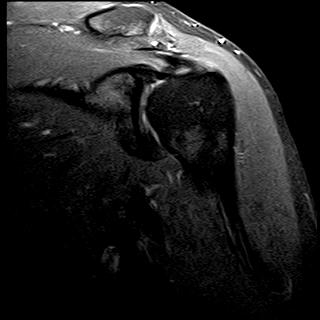
[im 11/18]
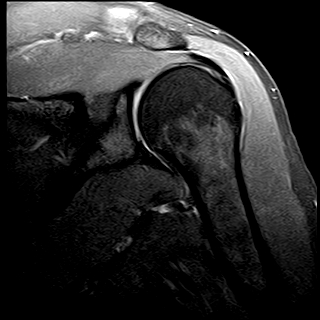
[im 14/18]
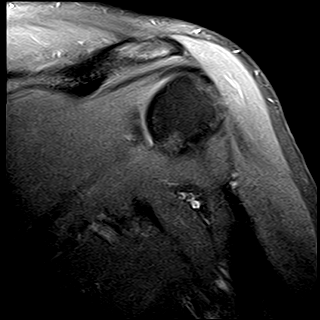
[im 18/18]
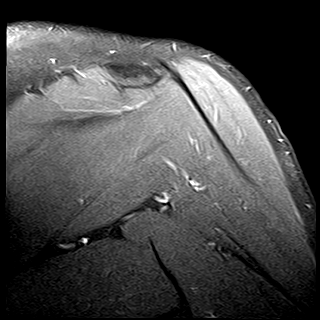

[Series 12: STIR · oblique · left · 3.5mm · 0.56mm/px · 5 of 18 slices shown (2 of 2)]
[im 1/18]
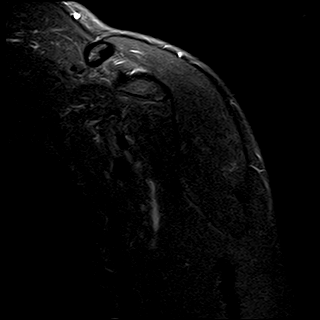
[im 4/18]
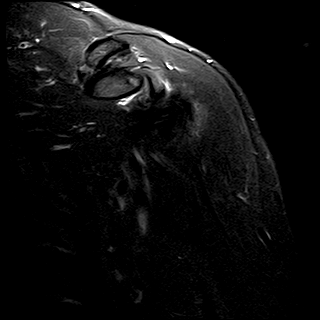
[im 7/18]
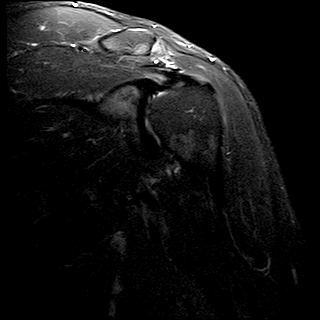
[im 11/18]
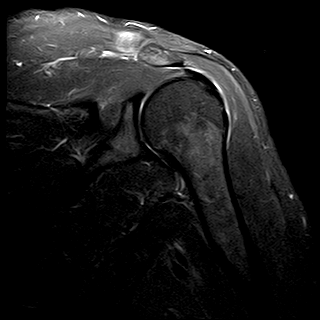
[im 14/18]
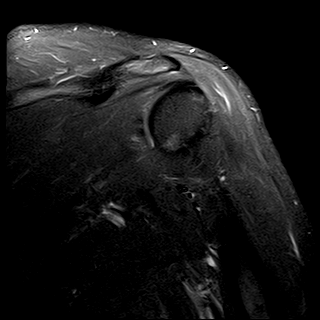

[39 of 40 positions shown; findings below may reference images not displayed]

FINDINGS: There is a small, linear, full thickness tear involving the supraspinatus tendon.  The infraspinatus tendon appears intact.  There is a small amount of fluid within the glenohumeral joint as well as trace fluid within the subacromial-subdeltoid bursa.  

The glenoid labrum is intact.  There is no fracture, dislocation, bone contusion, or significant marrow signal alteration.  There is mild impingement on the supraspinatus muscle by a down sloping acromion.
IMPRESSION: Small rotator cuff tear.

## 2021-09-24 IMAGING — DX ABDOMEN XRAY 2 VIEWS
1 series · 3 of 3 positions shown · non-contrast
Comparison: None available.

﻿EXAM:  [id number]   ABDOMEN XRAY 2 VIEWS
INDICATION: Abdominal pain and constipation for 4 weeks.  Previous gallbladder and appendix surgery. History of kidney stones
TECHNIQUE: Supine AP view of the abdomen.

[Series 1: apsupine · 0.14mm/px · 3 of 3 slices shown]
[im 1/3]
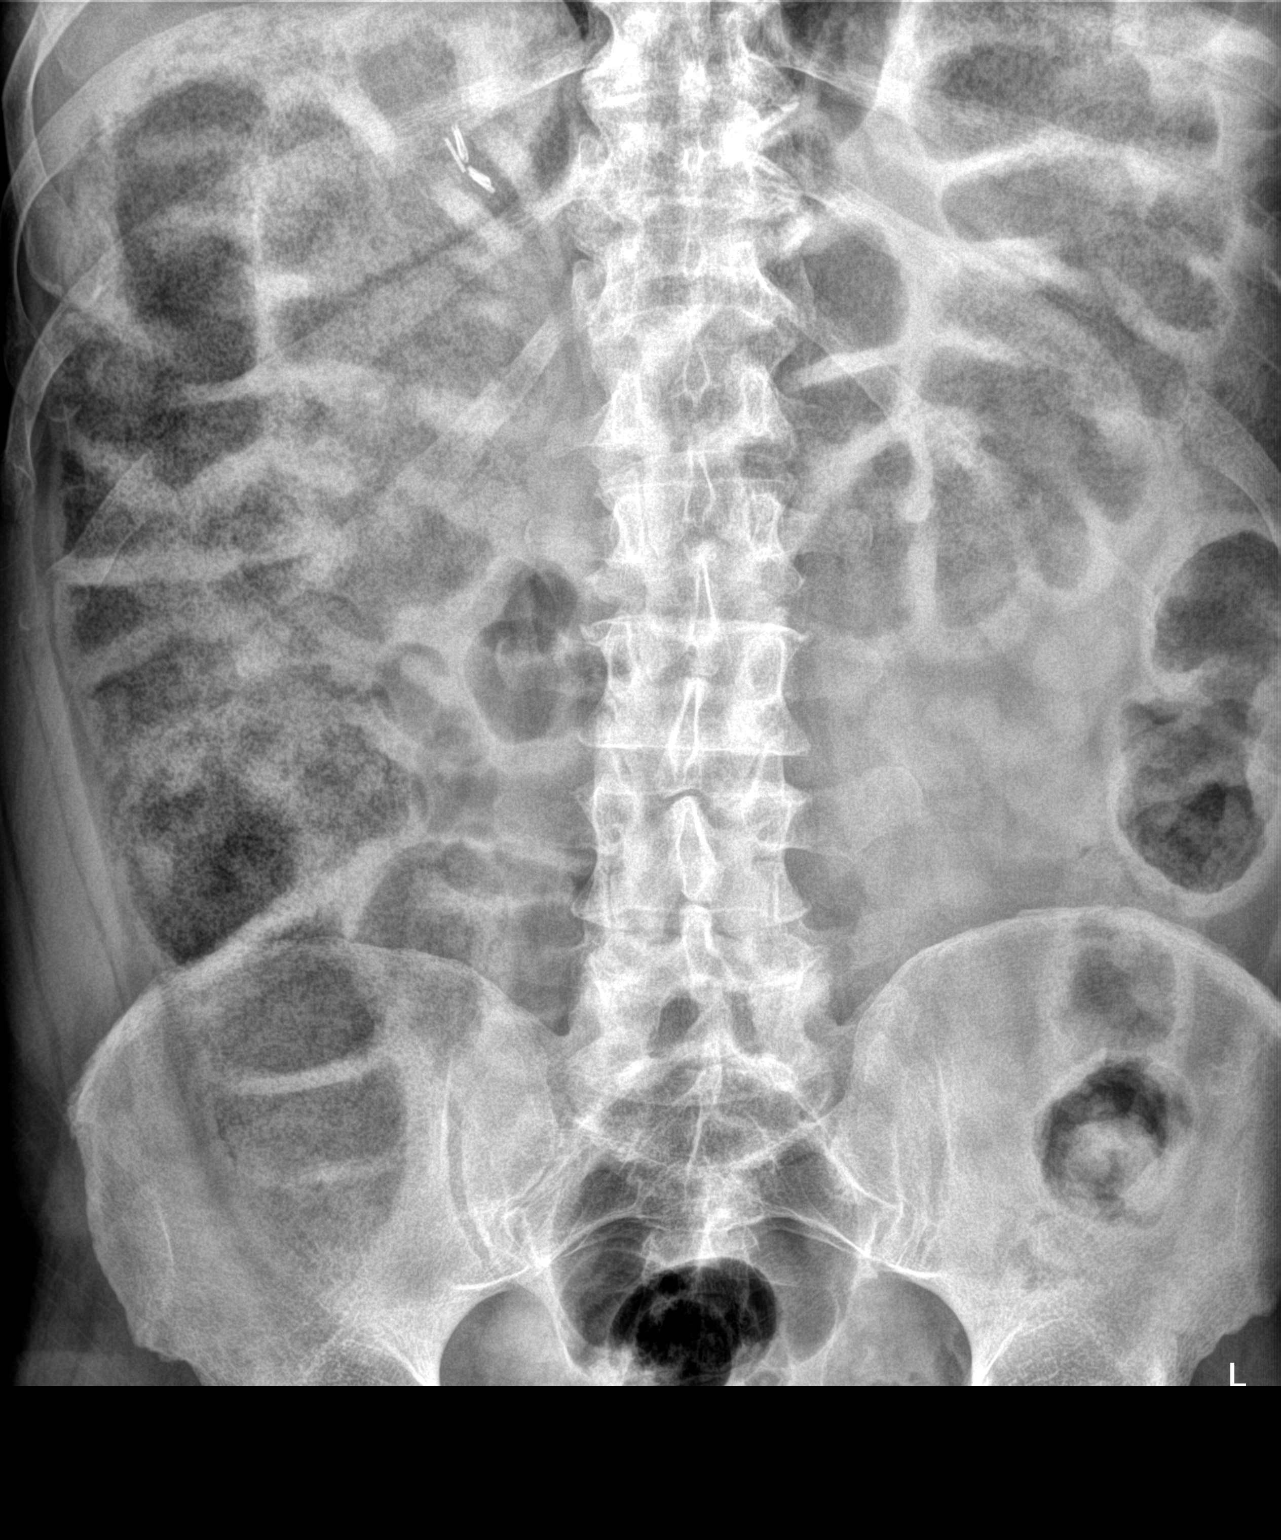
[im 2/3]
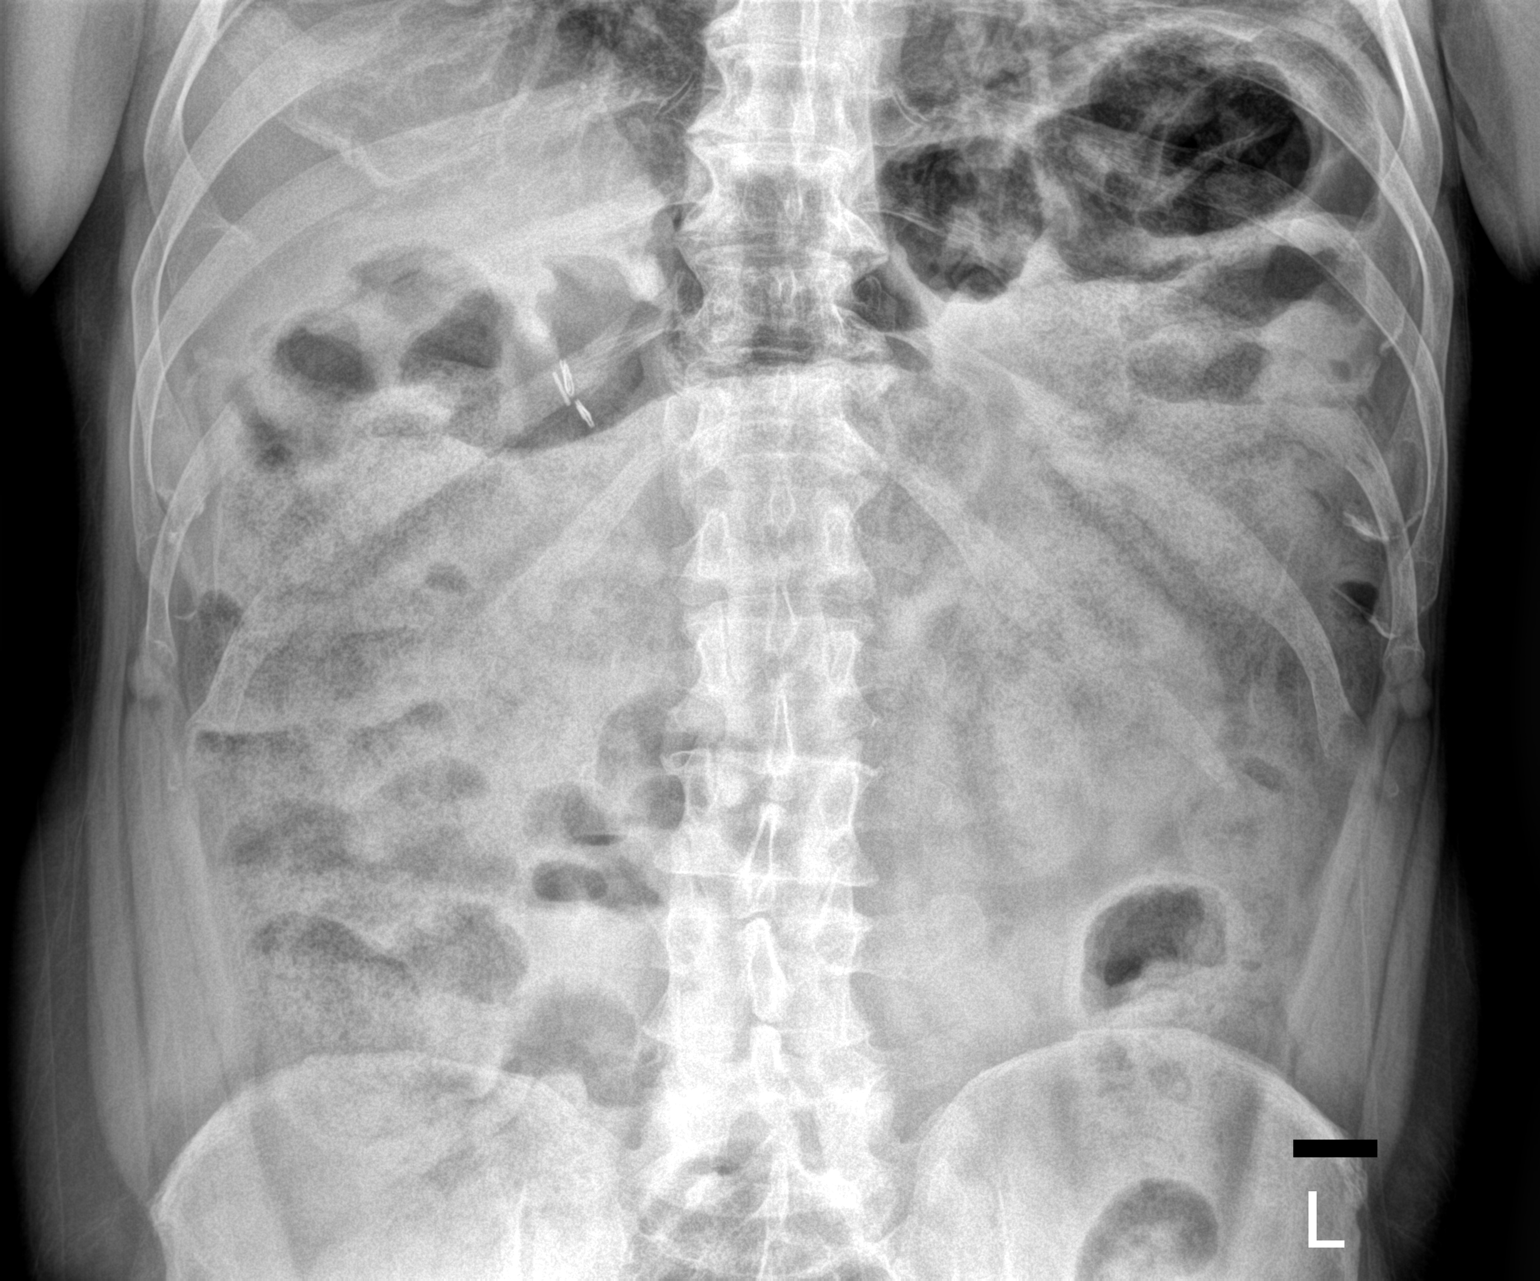
[im 3/3]
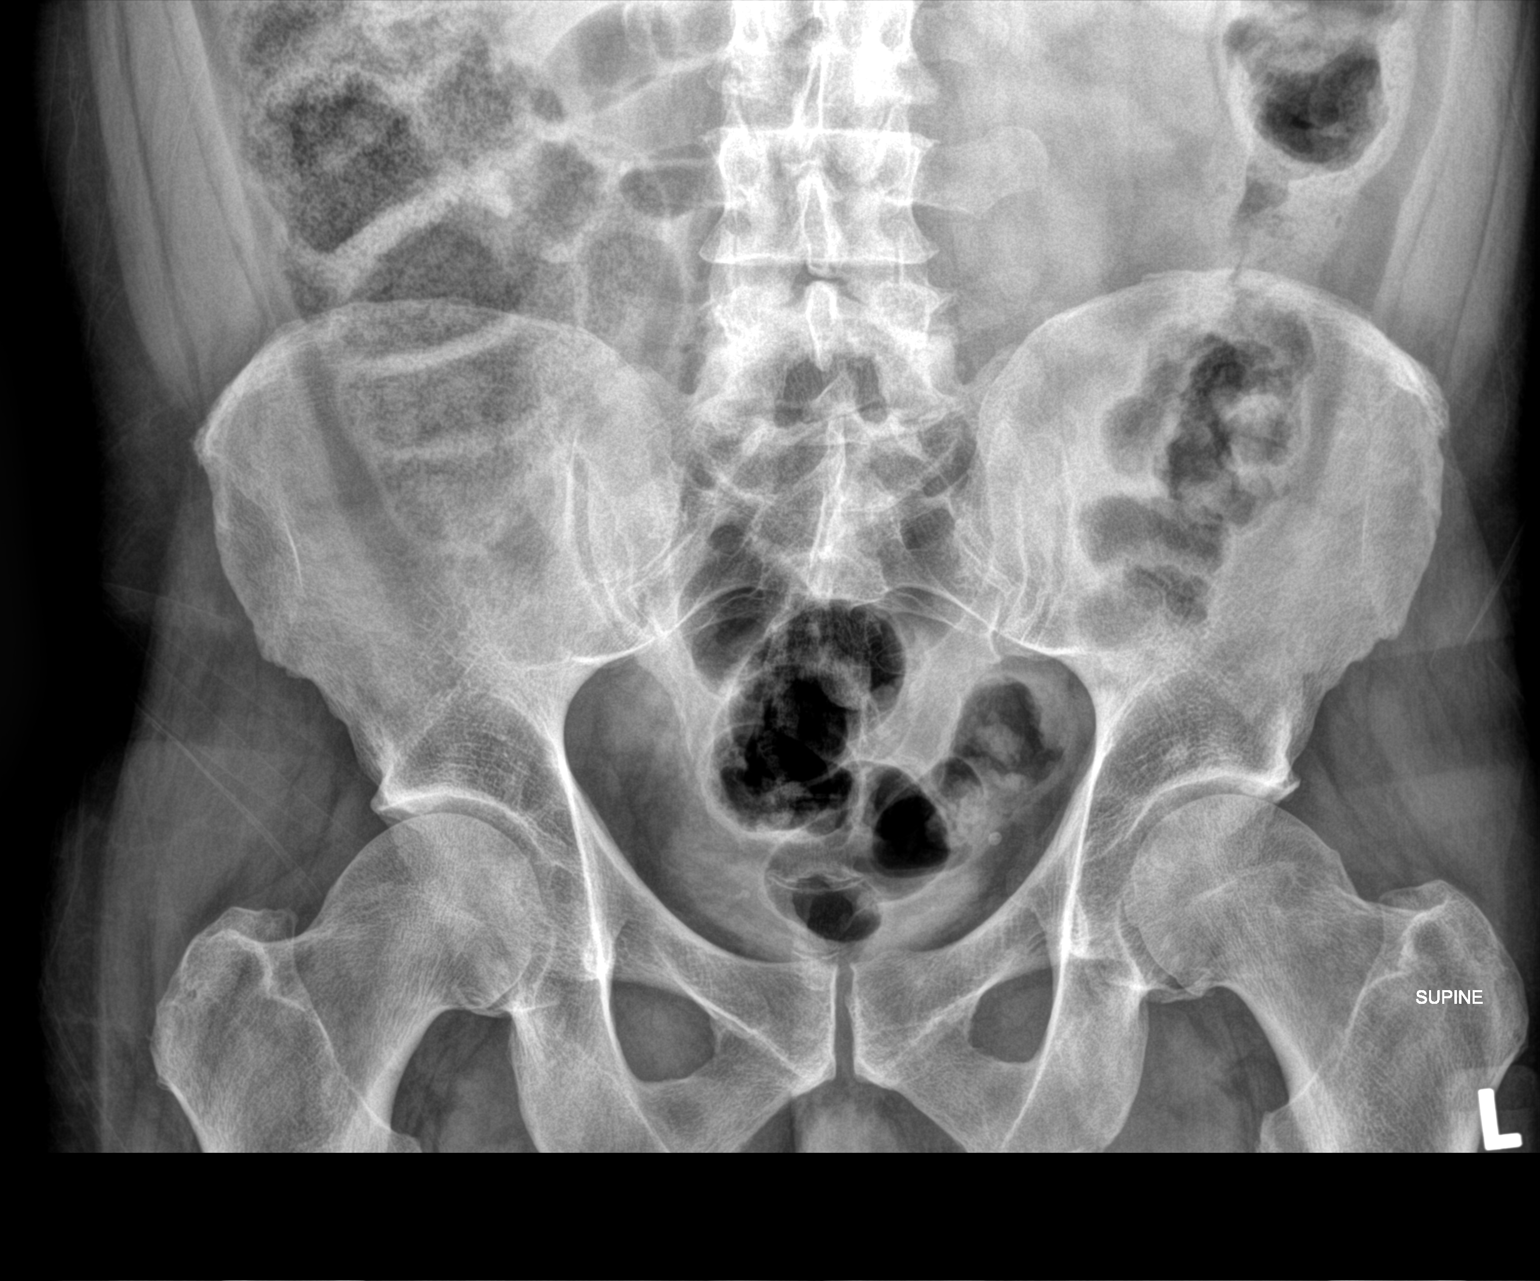

[3 of 3 positions shown; findings below may reference images not displayed]

FINDINGS: Moderate diffuse fecal impaction of proximal colon.  No abnormal soft tissue densities or calcific densities are seen.  Postoperative changes of cholecystectomy.
IMPRESSION: Limited supine radiograph of abdomen shows postsurgical changes of cholecystectomy and moderate fecal impaction of colon.  If symptoms warrant, further imaging with CT abdomen and pelvis is recommended.

## 2021-09-25 ENCOUNTER — Emergency Department (HOSPITAL_BASED_OUTPATIENT_CLINIC_OR_DEPARTMENT_OTHER): Payer: MEDICAID

## 2021-09-25 ENCOUNTER — Emergency Department
Admission: EM | Admit: 2021-09-25 | Discharge: 2021-09-25 | Disposition: A | Discharge status: Home / Self Care | Payer: MEDICAID | Attending: Family | Admitting: Family

## 2021-09-25 ENCOUNTER — Other Ambulatory Visit: Payer: Self-pay

## 2021-09-25 ENCOUNTER — Encounter (HOSPITAL_BASED_OUTPATIENT_CLINIC_OR_DEPARTMENT_OTHER): Payer: Self-pay

## 2021-09-25 DIAGNOSIS — K5641 Fecal impaction: Secondary | ICD-10-CM | POA: Insufficient documentation

## 2021-09-25 DIAGNOSIS — K5900 Constipation, unspecified: Secondary | ICD-10-CM

## 2021-09-25 DIAGNOSIS — Z79899 Other long term (current) drug therapy: Secondary | ICD-10-CM | POA: Insufficient documentation

## 2021-09-25 LAB — CBC WITH DIFF
BASOPHIL #: 0.02 10*3/uL (ref 0.00–0.30)
BASOPHIL %: 0 % (ref 0–3)
EOSINOPHIL #: 0.27 10*3/uL (ref 0.00–0.80)
EOSINOPHIL %: 4 % (ref 0–7)
HCT: 43.8 % (ref 42.0–51.0)
HGB: 14.7 g/dL (ref 13.5–18.0)
LYMPHOCYTE #: 1.2 10*3/uL (ref 1.10–5.00)
LYMPHOCYTE %: 18 % — ABNORMAL LOW (ref 25–45)
MCH: 31.8 pg (ref 27.0–32.0)
MCHC: 33.7 g/dL (ref 32.0–36.0)
MCV: 94.4 fL (ref 78.0–99.0)
MONOCYTE #: 0.43 10*3/uL (ref 0.00–1.30)
MONOCYTE %: 7 % (ref 0–12)
MPV: 7.9 fL (ref 7.4–10.4)
NEUTROPHIL #: 4.57 10*3/uL (ref 1.80–8.40)
NEUTROPHIL %: 70 % (ref 40–76)
PLATELETS: 314 10*3/uL (ref 140–440)
RBC: 4.64 10*6/uL (ref 4.20–6.00)
RDW: 15.6 % — ABNORMAL HIGH (ref 11.6–14.8)
WBC: 6.5 10*3/uL (ref 4.0–10.5)

## 2021-09-25 LAB — COMPREHENSIVE METABOLIC PANEL, NON-FASTING
ALBUMIN/GLOBULIN RATIO: 1.2 (ref 0.8–1.4)
ALBUMIN: 3.6 g/dL (ref 3.4–5.0)
ALKALINE PHOSPHATASE: 88 U/L (ref 46–116)
ALT (SGPT): 21 U/L (ref <=78)
ANION GAP: 4 mmol/L (ref 4–13)
AST (SGOT): 15 U/L (ref 15–37)
BILIRUBIN TOTAL: 0.3 mg/dL (ref 0.2–1.0)
BUN/CREA RATIO: 16
BUN: 14 mg/dL (ref 7–18)
CALCIUM, CORRECTED: 8.7 mg/dL
CALCIUM: 8.3 mg/dL — ABNORMAL LOW (ref 8.5–10.1)
CHLORIDE: 103 mmol/L (ref 98–107)
CO2 TOTAL: 31 mmol/L (ref 21–32)
CREATININE: 0.88 mg/dL (ref 0.70–1.30)
ESTIMATED GFR: 105 mL/min/{1.73_m2} (ref >59)
GLOBULIN: 3.1
GLUCOSE: 95 mg/dL (ref 74–106)
OSMOLALITY, CALCULATED: 276 mOsm/kg (ref 270–290)
POTASSIUM: 3.9 mmol/L (ref 3.5–5.1)
PROTEIN TOTAL: 6.7 g/dL (ref 6.4–8.2)
SODIUM: 138 mmol/L (ref 136–145)

## 2021-09-25 LAB — PT/INR
INR: 0.97 (ref 0.88–1.10)
PROTHROMBIN TIME: 11.3 seconds (ref 9.8–12.7)

## 2021-09-25 LAB — LACTIC ACID LEVEL W/ REFLEX FOR LEVEL >2.0: LACTIC ACID: 1 mmol/L (ref 0.4–2.0)

## 2021-09-25 LAB — PTT (PARTIAL THROMBOPLASTIN TIME): APTT: 34.7 seconds — ABNORMAL HIGH (ref 22.4–31.7)

## 2021-09-25 MED ORDER — BISACODYL 10 MG RECTAL SUPPOSITORY
10.0000 mg | Freq: Once | RECTAL | 0 refills | Status: AC
Start: 2021-09-25 — End: 2021-09-25

## 2021-09-25 MED ORDER — MAGNESIUM CITRATE ORAL SOLUTION
296.0000 mL | Freq: Once | ORAL | 0 refills | Status: AC
Start: 2021-09-25 — End: 2021-09-25

## 2021-09-25 MED ORDER — KETOROLAC 30 MG/ML (1 ML) INJECTION SOLUTION
INTRAMUSCULAR | Status: AC
Start: 2021-09-25 — End: 2021-09-25
  Filled 2021-09-25: qty 1

## 2021-09-25 MED ORDER — IOHEXOL 350 MG IODINE/ML INTRAVENOUS SOLUTION
100.0000 mL | INTRAVENOUS | Status: AC
Start: 2021-09-25 — End: 2021-09-25
  Administered 2021-09-25: 75 mL via INTRAVENOUS

## 2021-09-25 NOTE — ED Provider Notes (Signed)
[facility], [facility] Emergency Department  ED Primary Provider Note  History of Present Illness   Chief Complaint   Patient presents with    Abdominal Pain     Alec Friedman is a 51 y.o. male who had concerns including Abdominal Pain.  Arrival: The patient arrived by Car    Patient 51 year old male to the emergency department complaining of abdominal pain and constipation.  Patient was seen by his primary care provider and had KUB yesterday.  Results showed diffuse impaction of his proximal colon.  Patient denies any recent abdominal surgeries.  Patient denies history of obstruction in the past.  Patient denies nausea or vomiting.  Patient states his last bowel movement was last Friday.  Patient states he has tried enemas and oral medication with no luck.  Patient is currently on pain medication due to recent back surgery.    Review of Systems   Pertinent positive and negative ROS as per HPI.  Historical Data   History Reviewed This Encounter: Medical History  Surgical History  Family History  Social History    Physical Exam   ED Triage Vitals [09/25/21 1112]   BP (Non-Invasive) 117/72   Heart Rate 73   Respiratory Rate 16   Temperature (!) 35.8 C (96.4 F)   SpO2 96 %   Weight 81.6 kg (180 lb)   Height 1.753 m (5' 9")     Physical Exam  Vitals and nursing note reviewed.   Constitutional:       General: He is not in acute distress.     Appearance: He is well-developed.   HENT:      Head: Normocephalic and atraumatic.   Eyes:      Conjunctiva/sclera: Conjunctivae normal.   Cardiovascular:      Rate and Rhythm: Normal rate and regular rhythm.      Heart sounds: No murmur heard.  Pulmonary:      Effort: Pulmonary effort is normal. No respiratory distress.      Breath sounds: Normal breath sounds.   Abdominal:      Palpations: Abdomen is soft.      Tenderness: There is no abdominal tenderness.   Musculoskeletal:         General: No swelling.      Cervical back: Neck supple.    Skin:     General: Skin is warm and dry.      Capillary Refill: Capillary refill takes less than 2 seconds.   Neurological:      Mental Status: He is alert.   Psychiatric:         Mood and Affect: Mood normal.     Patient Data     Labs Ordered/Reviewed   COMPREHENSIVE METABOLIC PANEL, NON-FASTING - Abnormal; Notable for the following components:       Result Value    CALCIUM 8.3 (*)     All other components within normal limits    Narrative:     Estimated Glomerular Filtration Rate (eGFR) is calculated using the CKD-EPI (2021) equation, intended for patients 30 years of age and older. If gender is not documented or "unknown", there will be no eGFR calculation.   PTT (PARTIAL THROMBOPLASTIN TIME) - Abnormal; Notable for the following components:    APTT 34.7 (*)     All other components within normal limits   CBC WITH DIFF - Abnormal; Notable for the following components:    RDW 15.6 (*)     LYMPHOCYTE % 18 (*)  All other components within normal limits   PT/INR - Normal   LACTIC ACID LEVEL W/ REFLEX FOR LEVEL >2.0 - Normal   CBC/DIFF    Narrative:     The following orders were created for panel order CBC/DIFF.  Procedure                               Abnormality         Status                     ---------                               -----------         ------                     CBC WITH RJJO[[id number]]                Abnormal            Final result                 Please view results for these tests on the individual orders.     CT ABDOMEN PELVIS W IV CONTRAST   Final Result by Edi, Radresults In (09/13 1319)   There is a moderate to large amount retained stool throughout the colon    . No acute inflammatory changes.      One or more dose reduction techniques were used (e.g., Automated exposure control, adjustment of the mA and/or kV according to patient size, use of iterative reconstruction technique).         Radiologist location ID: [place] [facility]        Medical Decision  Making  51 year old male to the emergency department complaining of constipation since Friday.  Patient currently on pain medication and states his last bowel movement was on Friday.  Patient is using MiraLax currently for constipation and also has tried lactulose.  Patient was sent by PCP due to KUB showing moderate amount of stool in the proximal colon.  Differential diagnosis include but are not limited to constipation versus bowel obstruction.  On physical examination patient's abdomen is soft and nontender with hypoactive bowel sounds.  No vomiting or nausea noted by patient.  Patient is afebrile with vital signs stable.  CT abdomen and pelvis with contrast ordered for evaluation of bowel obstruction.  On CT moderate amount of stool noted in the colon without obstruction normal gas pattern in abdomen.  Lab work within normal limits with WBC and lactic acid within normal limits indicating no bowel obstruction or mesenteric ischemia as well.  Patient discharged on Mag citrate and Dulcolax laxative.  Patient to increase fluid intake and increase fiber and diet as well.  After bowel movement patient to use MiraLax daily mi taking pain medication.  Patient discharged to return to ED if fever, severe abdominal pain or vomiting.    Amount and/or Complexity of Data Reviewed  Labs: ordered.  Radiology: ordered.    Risk  OTC drugs.             Medications Administered in the ED   iohexol (OMNIPAQUE 350) infusion (75 mL Intravenous Given 09/25/21 1311)     Clinical Impression   Constipation, unspecified constipation  type (Primary)       Disposition: Discharged

## 2021-09-25 NOTE — ED Triage Notes (Signed)
Pt states has been having difficulty with BM's for past month. Last BM's last Friday, small amount and "I had to use an enema" pt denies n/v. Pt was referred to er by PCP Alec Ober NP. [facility] and has reports with him. C/o epigastric pain and occasionally has pains in chest

## 2021-11-19 ENCOUNTER — Inpatient Hospital Stay (HOSPITAL_BASED_OUTPATIENT_CLINIC_OR_DEPARTMENT_OTHER)
Admission: RE | Admit: 2021-11-19 | Discharge: 2021-11-19 | Disposition: A | Discharge status: Home / Self Care | Payer: Medicare Other | Source: Ambulatory Visit

## 2021-11-19 ENCOUNTER — Other Ambulatory Visit (HOSPITAL_COMMUNITY): Payer: Self-pay | Admitting: Orthopaedic Surgery

## 2021-11-19 ENCOUNTER — Other Ambulatory Visit: Payer: Medicare Other | Attending: Orthopaedic Surgery

## 2021-11-19 ENCOUNTER — Other Ambulatory Visit: Payer: Self-pay

## 2021-11-19 DIAGNOSIS — Z01810 Encounter for preprocedural cardiovascular examination: Secondary | ICD-10-CM

## 2021-11-19 DIAGNOSIS — Z01818 Encounter for other preprocedural examination: Secondary | ICD-10-CM | POA: Insufficient documentation

## 2021-11-19 LAB — ECG 12 LEAD
Atrial Rate: 62 {beats}/min
Calculated P Axis: 62 degrees
Calculated R Axis: 70 degrees
Calculated T Axis: 46 degrees
PR Interval: 142 ms
QRS Duration: 96 ms
QT Interval: 412 ms
QTC Calculation: 418 ms
Ventricular rate: 62 {beats}/min

## 2021-11-19 LAB — BASIC METABOLIC PANEL
ANION GAP: 4 mmol/L (ref 4–13)
BUN/CREA RATIO: 15 (ref 6–22)
BUN: 14 mg/dL (ref 7–25)
CALCIUM: 8.8 mg/dL (ref 8.6–10.3)
CHLORIDE: 106 mmol/L (ref 98–107)
CO2 TOTAL: 28 mmol/L (ref 21–31)
CREATININE: 0.95 mg/dL (ref 0.60–1.30)
ESTIMATED GFR: 98 mL/min/{1.73_m2} (ref >59)
GLUCOSE: 86 mg/dL (ref 74–109)
OSMOLALITY, CALCULATED: 276 mOsm/kg (ref 270–290)
POTASSIUM: 4.7 mmol/L (ref 3.5–5.1)
SODIUM: 138 mmol/L (ref 136–145)

## 2021-11-19 LAB — CBC WITH DIFF
BASOPHIL #: 0.1 10*3/uL (ref 0.00–0.10)
BASOPHIL %: 2 % — ABNORMAL HIGH (ref 0–1)
EOSINOPHIL #: 0.2 10*3/uL (ref 0.00–0.50)
EOSINOPHIL %: 4 %
HCT: 41.1 % (ref 36.7–47.1)
HGB: 14.3 g/dL (ref 12.5–16.3)
LYMPHOCYTE #: 1.1 10*3/uL (ref 1.00–3.00)
LYMPHOCYTE %: 19 % (ref 16–44)
MCH: 32.2 pg (ref 23.8–33.4)
MCHC: 34.7 g/dL (ref 32.5–36.3)
MCV: 92.9 fL (ref 73.0–96.2)
MONOCYTE #: 0.5 10*3/uL (ref 0.30–1.00)
MONOCYTE %: 8 % (ref 5–13)
MPV: 8 fL (ref 7.4–11.4)
NEUTROPHIL #: 3.8 10*3/uL (ref 1.85–7.80)
NEUTROPHIL %: 67 % (ref 43–77)
PLATELETS: 335 10*3/uL (ref 140–440)
RBC: 4.43 10*6/uL (ref 4.06–5.63)
RDW: 13.3 % (ref 12.1–16.2)
WBC: 5.6 10*3/uL (ref 3.6–10.2)

## 2021-11-19 LAB — URINALYSIS, MICROSCOPIC
BACTERIA: NEGATIVE /hpf
RBCS: 2 /hpf (ref <4)
WBCS: 2 /hpf (ref <6)

## 2021-11-19 LAB — URINALYSIS, MACROSCOPIC
BILIRUBIN: NEGATIVE mg/dL
BLOOD: NEGATIVE mg/dL
GLUCOSE: NEGATIVE mg/dL
KETONES: NEGATIVE mg/dL
LEUKOCYTES: NEGATIVE WBCs/uL
NITRITE: NEGATIVE
PH: 6.5 (ref 5.0–9.0)
PROTEIN: 10 mg/dL
SPECIFIC GRAVITY: 1.027 (ref 1.002–1.030)
UROBILINOGEN: 2 mg/dL — AB

## 2021-12-10 ENCOUNTER — Ambulatory Visit (HOSPITAL_COMMUNITY): Payer: Medicare Other | Admitting: Anesthesiology

## 2021-12-10 ENCOUNTER — Encounter (HOSPITAL_COMMUNITY): Payer: Self-pay | Admitting: Orthopaedic Surgery

## 2021-12-10 ENCOUNTER — Inpatient Hospital Stay
Admission: RE | Admit: 2021-12-10 | Discharge: 2021-12-10 | Disposition: A | Discharge status: Home / Self Care | Payer: Medicare Other | Attending: Orthopaedic Surgery | Admitting: Orthopaedic Surgery

## 2021-12-10 ENCOUNTER — Other Ambulatory Visit: Payer: Self-pay

## 2021-12-10 ENCOUNTER — Encounter (HOSPITAL_COMMUNITY)
Admission: RE | Disposition: A | Discharge status: Home / Self Care | Payer: Self-pay | Source: Home / Self Care | Attending: Orthopaedic Surgery

## 2021-12-10 ENCOUNTER — Encounter (HOSPITAL_COMMUNITY): Payer: Medicare Other | Admitting: Orthopaedic Surgery

## 2021-12-10 DIAGNOSIS — M75112 Incomplete rotator cuff tear or rupture of left shoulder, not specified as traumatic: Secondary | ICD-10-CM | POA: Insufficient documentation

## 2021-12-10 SURGERY — ARTHROSCOPY SHOULDER
Anesthesia: General | Site: Shoulder | Laterality: Left | Wound class: Clean Wound: Uninfected operative wounds in which no inflammation occurred

## 2021-12-10 MED ORDER — PROPOFOL 10 MG/ML IV BOLUS
INJECTION | Freq: Once | INTRAVENOUS | Status: DC | PRN
Start: 2021-12-10 — End: 2021-12-10
  Administered 2021-12-10: 200 mg via INTRAVENOUS

## 2021-12-10 MED ORDER — SODIUM CHLORIDE 0.9 % (FLUSH) INJECTION SYRINGE
3.0000 mL | INJECTION | Freq: Three times a day (TID) | INTRAMUSCULAR | Status: DC
Start: 2021-12-10 — End: 2021-12-10

## 2021-12-10 MED ORDER — ONDANSETRON HCL (PF) 4 MG/2 ML INJECTION SOLUTION
4.0000 mg | Freq: Once | INTRAMUSCULAR | Status: DC | PRN
Start: 2021-12-10 — End: 2021-12-10

## 2021-12-10 MED ORDER — SODIUM CHLORIDE 0.9 % (FLUSH) INJECTION SYRINGE
3.0000 mL | INJECTION | INTRAMUSCULAR | Status: DC | PRN
Start: 2021-12-10 — End: 2021-12-10

## 2021-12-10 MED ORDER — LACTATED RINGERS INTRAVENOUS SOLUTION
INTRAVENOUS | Status: DC
Start: 2021-12-10 — End: 2021-12-10

## 2021-12-10 MED ORDER — SUGAMMADEX 100 MG/ML INTRAVENOUS SOLUTION
Freq: Once | INTRAVENOUS | Status: DC | PRN
Start: 2021-12-10 — End: 2021-12-10
  Administered 2021-12-10: 200 mg via INTRAVENOUS

## 2021-12-10 MED ORDER — FENTANYL (PF) 50 MCG/ML INJECTION WRAPPER
INJECTION | Freq: Once | INTRAMUSCULAR | Status: DC | PRN
Start: 2021-12-10 — End: 2021-12-10
  Administered 2021-12-10: 100 ug via INTRAVENOUS

## 2021-12-10 MED ORDER — MORPHINE 10 MG/ML INJECTION WRAPPER
INTRAVENOUS | Status: AC
Start: 2021-12-10 — End: 2021-12-10
  Filled 2021-12-10: qty 2

## 2021-12-10 MED ORDER — MIDAZOLAM 5 MG/ML INJECTION WRAPPER
INTRAMUSCULAR | Status: AC
Start: 2021-12-10 — End: 2021-12-10
  Filled 2021-12-10: qty 1

## 2021-12-10 MED ORDER — MIDAZOLAM 5 MG/ML INJECTION WRAPPER
2.0000 mg | Freq: Once | INTRAMUSCULAR | Status: DC | PRN
Start: 2021-12-10 — End: 2021-12-10
  Administered 2021-12-10: 2 mg via INTRAVENOUS

## 2021-12-10 MED ORDER — MORPHINE 10 MG/ML INJECTION WRAPPER
Freq: Once | INTRAVENOUS | Status: DC | PRN
Start: 2021-12-10 — End: 2021-12-10

## 2021-12-10 MED ORDER — HYDROMORPHONE 2 MG/ML INJECTION WRAPPER
1.0000 mg | INJECTION | Freq: Once | INTRAMUSCULAR | Status: DC | PRN
Start: 2021-12-10 — End: 2021-12-10

## 2021-12-10 MED ORDER — SODIUM CHLORIDE 0.9 % INTRAVENOUS PIGGYBACK
2.0000 g | INJECTION | Freq: Once | INTRAVENOUS | Status: AC
Start: 2021-12-10 — End: 2021-12-10
  Administered 2021-12-10: 2 g via INTRAVENOUS

## 2021-12-10 MED ORDER — FENTANYL (PF) 50 MCG/ML INJECTION SOLUTION
INTRAMUSCULAR | Status: AC
Start: 2021-12-10 — End: 2021-12-10
  Filled 2021-12-10: qty 2

## 2021-12-10 MED ORDER — DEXAMETHASONE SODIUM PHOSPHATE 4 MG/ML INJECTION SOLUTION
Freq: Once | INTRAMUSCULAR | Status: DC | PRN
Start: 2021-12-10 — End: 2021-12-10
  Administered 2021-12-10: 4 mg via INTRAVENOUS

## 2021-12-10 MED ORDER — IPRATROPIUM 0.5 MG-ALBUTEROL 3 MG (2.5 MG BASE)/3 ML NEBULIZATION SOLN
3.0000 mL | INHALATION_SOLUTION | Freq: Once | RESPIRATORY_TRACT | Status: DC | PRN
Start: 2021-12-10 — End: 2021-12-10

## 2021-12-10 MED ORDER — ROCURONIUM 10 MG/ML INTRAVENOUS SOLUTION
Freq: Once | INTRAVENOUS | Status: DC | PRN
Start: 2021-12-10 — End: 2021-12-10
  Administered 2021-12-10: 50 mg via INTRAVENOUS

## 2021-12-10 MED ORDER — ONDANSETRON HCL (PF) 4 MG/2 ML INJECTION SOLUTION
INTRAMUSCULAR | Status: AC
Start: 2021-12-10 — End: 2021-12-10
  Filled 2021-12-10: qty 2

## 2021-12-10 MED ORDER — HYDROCODONE 7.5 MG-ACETAMINOPHEN 325 MG TABLET
1.0000 | ORAL_TABLET | ORAL | 0 refills | Status: DC | PRN
Start: 2021-12-10 — End: 2022-09-10

## 2021-12-10 MED ORDER — SODIUM CHLORIDE 0.9 % INTRAVENOUS PIGGYBACK
INJECTION | INTRAVENOUS | Status: AC
Start: 2021-12-10 — End: 2021-12-10
  Filled 2021-12-10: qty 100

## 2021-12-10 MED ORDER — FENTANYL (PF) 50 MCG/ML INJECTION WRAPPER
50.0000 ug | INJECTION | INTRAMUSCULAR | Status: DC | PRN
Start: 2021-12-10 — End: 2021-12-10
  Administered 2021-12-10: 50 ug via INTRAVENOUS

## 2021-12-10 MED ORDER — DEXAMETHASONE SODIUM PHOSPHATE 4 MG/ML INJECTION SOLUTION
INTRAMUSCULAR | Status: AC
Start: 2021-12-10 — End: 2021-12-10
  Filled 2021-12-10: qty 1

## 2021-12-10 MED ORDER — FAMOTIDINE (PF) 20 MG/2 ML INTRAVENOUS SOLUTION
INTRAVENOUS | Status: AC
Start: 2021-12-10 — End: 2021-12-10
  Filled 2021-12-10: qty 2

## 2021-12-10 MED ORDER — CEFAZOLIN 1 GRAM SOLUTION FOR INJECTION
INTRAMUSCULAR | Status: AC
Start: 2021-12-10 — End: 2021-12-10
  Filled 2021-12-10: qty 20

## 2021-12-10 MED ORDER — DEXAMETHASONE SODIUM PHOSPHATE 4 MG/ML INJECTION SOLUTION
4.0000 mg | Freq: Once | INTRAMUSCULAR | Status: AC
Start: 2021-12-10 — End: 2021-12-10
  Administered 2021-12-10: 4 mg via INTRAVENOUS

## 2021-12-10 MED ORDER — OXYCODONE-ACETAMINOPHEN 5 MG-325 MG TABLET
1.0000 | ORAL_TABLET | Freq: Once | ORAL | Status: DC | PRN
Start: 2021-12-10 — End: 2021-12-10
  Administered 2021-12-10: 1 via ORAL
  Filled 2021-12-10: qty 1

## 2021-12-10 MED ORDER — ALBUTEROL SULFATE 2.5 MG/3 ML (0.083 %) SOLUTION FOR NEBULIZATION
2.5000 mg | INHALATION_SOLUTION | Freq: Once | RESPIRATORY_TRACT | Status: DC | PRN
Start: 2021-12-10 — End: 2021-12-10

## 2021-12-10 MED ORDER — PROCHLORPERAZINE EDISYLATE 10 MG/2 ML (5 MG/ML) INJECTION SOLUTION
5.0000 mg | Freq: Once | INTRAMUSCULAR | Status: DC | PRN
Start: 2021-12-10 — End: 2021-12-10
  Administered 2021-12-10 (×2): 5 mg via INTRAVENOUS
  Filled 2021-12-10: qty 2

## 2021-12-10 MED ORDER — FENTANYL (PF) 50 MCG/ML INJECTION WRAPPER
25.0000 ug | INJECTION | INTRAMUSCULAR | Status: DC | PRN
Start: 2021-12-10 — End: 2021-12-10

## 2021-12-10 MED ORDER — LIDOCAINE (PF) 100 MG/5 ML (2 %) INTRAVENOUS SYRINGE
INJECTION | Freq: Once | INTRAVENOUS | Status: DC | PRN
Start: 2021-12-10 — End: 2021-12-10
  Administered 2021-12-10: 50 mg via INTRAVENOUS

## 2021-12-10 MED ORDER — DIPHENHYDRAMINE 50 MG/ML INJECTION SOLUTION
Freq: Once | INTRAMUSCULAR | Status: DC | PRN
Start: 2021-12-10 — End: 2021-12-10
  Administered 2021-12-10: 12.5 mg via INTRAVENOUS

## 2021-12-10 MED ORDER — ROPIVACAINE (PF) 2 MG/ML (0.2 %) INJECTION SOLUTION
INTRAMUSCULAR | Status: AC
Start: 2021-12-10 — End: 2021-12-10
  Filled 2021-12-10: qty 20

## 2021-12-10 MED ORDER — ROPIVACAINE (PF) 2 MG/ML (0.2 %) INJECTION SOLUTION
INTRAMUSCULAR | Status: AC
Start: 2021-12-10 — End: 2021-12-10
  Filled 2021-12-10: qty 100

## 2021-12-10 MED ORDER — FAMOTIDINE (PF) 20 MG/2 ML INTRAVENOUS SOLUTION
20.0000 mg | Freq: Once | INTRAVENOUS | Status: AC
Start: 2021-12-10 — End: 2021-12-10
  Administered 2021-12-10: 20 mg via INTRAVENOUS

## 2021-12-10 MED ORDER — LIDOCAINE HCL 4 % LARYNGOTRACHEAL SOLUTION
LARYNGEAL | Status: AC
Start: 2021-12-10 — End: 2021-12-10
  Filled 2021-12-10: qty 1

## 2021-12-10 MED ORDER — ONDANSETRON HCL (PF) 4 MG/2 ML INJECTION SOLUTION
4.0000 mg | Freq: Once | INTRAMUSCULAR | Status: AC
Start: 2021-12-10 — End: 2021-12-10
  Administered 2021-12-10: 4 mg via INTRAVENOUS

## 2021-12-10 MED ORDER — ROPIVACAINE (PF) 2 MG/ML (0.2 %) INJECTION SOLUTION
Freq: Once | INTRAMUSCULAR | Status: DC | PRN
Start: 2021-12-10 — End: 2021-12-10

## 2021-12-10 SURGICAL SUPPLY — 110 items
ABSORBENT FLOOR MAT ROLL WTRPRF BACKSHEET (MED SURG SUPPLIES) ×3
BANDAGE COFLX 5YDX4IN NONST CHSV SLF ADH FOAM COMPRESS TAN LF (WOUND CARE SUPPLY) ×1 IMPLANT
BANDAGE COFLX 5YDX4IN NONST CH_SV SLF ADH FOAM COMPRESS TAN (WOUND CARE/ENTEROSTOMAL SUPPLY) ×1
BLADE 11 2 END CBNSTL SURG STRL DISP (SURGICAL CUTTING SUPPLIES) ×1 IMPLANT
BLADE SHAVER 13CM 4MM COOLCUT 2 CUT POWER SFT TISS RESCT STRL DISP (ENDOSCOPIC SUPPLIES) IMPLANT
BLADE SHAVER 13CM 4MM COOLCUT_2 CUT PWR SFT TISS RESCT STRL (INSTRUMENTS ENDOMECHANICAL)
BLADE SHAVER 13CM 4MM EXCLBR C_OOLCUT STRL DISP (ENDOSCOPIC SUPPLIES) ×1 IMPLANT
BLADE SHAVER 13CM 5.5MM EXCLBR COOLCUT STRL DISP (ENDOSCOPIC SUPPLIES) IMPLANT
BURR SHAVER 13CM 4MM COOLCUT 8 FLUTE OVAL STRL DISP (ENDOSCOPIC SUPPLIES) ×1 IMPLANT
BURR SHAVER 13CM 4MM COOLCUT 8_FLUTE OVL STRL DISP (INSTRUMENTS ENDOMECHANICAL) ×1
CLEANER INSTR PREPZYME MUL-TRD CONTAINR NARSL NEUT PH BDGR (MISCELLANEOUS PT CARE ITEMS) ×1
CLEANER INSTR PREPZYME MUL-TRD CONTAINR NARSL NEUT PH BDGR 22OZ (MISCELLANEOUS PT CARE ITEMS) ×1
CONV USE 31829 - NEEDLE HYPO  18GA 1.5IN STD REG BVL LF (MED SURG SUPPLIES) ×1 IMPLANT
CONV USE ITEM 321837 - GLOVE SURG 7.5 LTX PF NONST CRM (GLOVES AND ACCESSORIES) ×1 IMPLANT
CONV USE ITEM 321863 - GLOVE SURG 6.5 LF PF SMOOTH STRL GRN  PLISPRN MICRO (GLOVES AND ACCESSORIES) ×2 IMPLANT
CONV USE ITEM 321982 - GLOVE SURG 7 LTX CHEMO PF SMOOTH BEAD CUF STRL WHT 11.6IN PLMR THK.2MM THK.21MM (GLOVES AND ACCESSORIES) IMPLANT
CONV USE ITEM 321983 - GLOVE SURG 8 LTX CHEMO PF SMOOTH BEAD CUF STRL WHT 11.6IN PLMR THK.2MM THK.21MM (GLOVES AND ACCESSORIES) ×1 IMPLANT
CONV USE ITEM 329146 - CLEANER INSTR PREPZYME MUL-TRD CONTAINR NARSL NEUT PH BDGR 22OZ (MISCELLANEOUS PT CARE ITEMS) ×1 IMPLANT
COUNTER 20 CNT BLOCK ADH NEEDLE STRL LF  RD SHARP FOAM 15.75X11.5X14IN DISP (MED SURG SUPPLIES) ×1 IMPLANT
COUNTER 20 CNT BLOCK ADH NEEDLE STRL LF RD SHARP FOAM 15.75 (MED SURG SUPPLIES) ×1
COVER 53X24IN MAYOSTAND PRXM STRL DISP EQP SMS LF (DRAPE/PACKS/SHEETS/OR TOWEL) ×2 IMPLANT
COVER TBL 90X50IN STD SMS REINF FNFLD STRL LF  DISP (DRAPE/PACKS/SHEETS/OR TOWEL) ×3 IMPLANT
COVER TBL 90X50IN STD SMS REINF FNFLD STRL LF DISP (DRAPE/PACKS/SHEETS/OR TOWEL) ×3
DISC USE ITEM 321235 - NEEDLE SUT MULTIFIRE SCORPION (SUTURE/WOUND CLOSURE) ×1
DISCONTINUED NO SUB - DRESS TRNSPR 4.5X4IN FILM IV STRL LF (WOUND CARE SUPPLY) ×1 IMPLANT
DISCONTINUED USE ITEM 321235 - NEEDLE SUT MULTIFIRE SCORPION (SUTURE/WOUND CLOSURE) ×1 IMPLANT
DRAPE 2 INCS FILM ANTIMIC 23X17IN IOBN STRL SURG (DRAPE/PACKS/SHEETS/OR TOWEL) ×1 IMPLANT
DRAPE 2 INCS FILM ANTIMIC 23X1_7IN IOBN STRL SURG (DRAPE/PACKS/SHEETS/OR TOWEL) ×1
DRAPE 2 INCS FILM ANTIMIC 23X2_3IN IOBN STRL SURG (DRAPE/PACKS/SHEETS/OR TOWEL) ×1
DRAPE FNFLD ABS REINF 77X53IN 43528 PRXM LF  STRL DISP SURG SMS 44X23IN (DRAPE/PACKS/SHEETS/OR TOWEL) ×4 IMPLANT
DRAPE FNFLD ABS REINF 77X53IN_43528 PRXM LF STRL DISP SURG (DRAPE/PACKS/SHEETS/OR TOWEL) ×4
DRAPE INCS ANTIMIC 23X23IN IOBN2 TRNSPR (DRAPE/PACKS/SHEETS/OR TOWEL) ×1 IMPLANT
DRAPE MAYOSTAND CVR 53X24IN PR_XM LF STRL DISP EQP SMS (DRAPE/PACKS/SHEETS/OR TOWEL) ×2
DRAPE SHOULDER CS/10_DYNJP8401UG (DRAPE/PACKS/SHEETS/OR TOWEL) ×1
DRESS TRNSPR 4.5X4IN FILM IV STRL LF (WOUND CARE/ENTEROSTOMAL SUPPLY) ×1
DRUG DEL ONQ 2.5IN 26.5IN PUMP KIT ANTIMIC CATH DEHP 2ML/HR SYSTEM 100ML STRL DISP (MED SURG SUPPLIES) ×1 IMPLANT
DRUG DEL ONQ PNBSTR SLSKR 2.5I_N PMP KT CATH DEHP 2ML/HR (MED SURG SUPPLIES) ×1
DURAPREP 26ML 8630 CS/20 (MED SURG SUPPLIES) ×1
GLOVE SURG 6 LF  PF BEAD CUF STRL CRM 11.3IN PROTEXIS PI (GLOVES AND ACCESSORIES)
GLOVE SURG 6 LF  PF BEAD CUF STRL CRM 11.3IN PROTEXIS PLISPRN THK9.1 MIL (GLOVES AND ACCESSORIES) IMPLANT
GLOVE SURG 6 LF  PF SMOOTH BEAD CUF STRL GRN 12IN SENSICARE PI PLISPRN PLMR ALOE THK7.9 MIL DISP (GLOVES AND ACCESSORIES) IMPLANT
GLOVE SURG 6 LF PF SMOOTH BEAD CUF STRL GRN 12IN SENSICARE (GLOVES AND ACCESSORIES)
GLOVE SURG 6.5 LF  PF BEAD CUF STRL CRM 11.3IN PROTEXIS PI (GLOVES AND ACCESSORIES)
GLOVE SURG 6.5 LF  PF BEAD CUF STRL CRM 11.3IN PROTEXIS PI PLISPRN THK9.1 MIL (GLOVES AND ACCESSORIES) IMPLANT
GLOVE SURG 6.5 LTX PF BEAD CUF MICRO ROUGHEN N-PYRG STRL STRW BGL SRG CURVE FINGER (GLOVES AND ACCESSORIES) IMPLANT
GLOVE SURG 6.5 LTX PF BEAD CUF_MICRO RGH N-PYRG STRL STRW (GLOVES AND ACCESSORIES)
GLOVE SURG 7 LF  PF BEAD CUF STRL CRM 11.8IN PROTEXIS PI (GLOVES AND ACCESSORIES)
GLOVE SURG 7 LF  PF BEAD CUF STRL CRM 11.8IN PROTEXIS PI PLISPRN THK9.1 MIL (GLOVES AND ACCESSORIES) IMPLANT
GLOVE SURG 7 LF  PF SMOOTH TXTR BEAD CUF STRL GRN 12IN SENSICARE PI PLISPRN SYN PLMR ALOE THK7.9 MIL (GLOVES AND ACCESSORIES) IMPLANT
GLOVE SURG 7 LF PF SMOOTH TXTR BEAD CUF STRL GRN 12IN (GLOVES AND ACCESSORIES)
GLOVE SURG 7 LTX CHEMO PF SMOOTH BEAD CUF STRL WHT 11.6IN PLMR THK.2MM THK.21MM (GLOVES AND ACCESSORIES)
GLOVE SURG 7 LTX PF SMOOTH STRL CRM (GLOVES AND ACCESSORIES)
GLOVE SURG 7.5 LF  PF BEAD CUF STRL CRM 11.8IN PROTEXIS PI (GLOVES AND ACCESSORIES) ×2
GLOVE SURG 7.5 LF  PF BEAD CUF STRL CRM 11.8IN PROTEXIS PI PLISPRN THK9.1 MIL (GLOVES AND ACCESSORIES) ×2 IMPLANT
GLOVE SURG 7.5 LF  PF SMOOTH BEAD CUF STRL GRN 12IN SENSICARE PI GRN PLISPRN PLMR ALOE THK7.9 MIL (GLOVES AND ACCESSORIES) IMPLANT
GLOVE SURG 7.5 LF PF SMOOTH BEAD CUF STRL GRN 12IN (GLOVES AND ACCESSORIES)
GLOVE SURG 7.5 LTX PF NONST CRM (GLOVES AND ACCESSORIES) ×1
GLOVE SURG 7.5 LTX PF SMOOTH STRL CRM (GLOVES AND ACCESSORIES) ×1
GLOVE SURG 8 LF  PF BEAD CUF SMOOTH TXTR STRL GRN 12IN SENSICARE PLISPRN SYN PLMR ALOE THK7.9 MIL (GLOVES AND ACCESSORIES) IMPLANT
GLOVE SURG 8 LF  PF BEAD CUF STRL CRM 11.8IN PROTEXIS PI PLISPRN THK9.1 MIL (GLOVES AND ACCESSORIES) IMPLANT
GLOVE SURG 8 LF PF BEAD CUF SMOOTH TXTR STRL GRN 12IN (GLOVES AND ACCESSORIES)
GLOVE SURG 8 LF PF BEAD CUF STRL CRM 11.8IN PROTEXIS PI (GLOVES AND ACCESSORIES)
GLOVE SURG 8 LTX CHEMO PF SMOOTH BEAD CUF STRL WHT 11.6IN PLMR THK.2MM THK.21MM (GLOVES AND ACCESSORIES) ×1
GLOVE SURG 8 LTX PF SMOOTH STRL CRM (GLOVES AND ACCESSORIES) ×1
GLOVE SURG 8.5 LF  PF BEAD CUF STRL CRM 11.8IN PROTEXIS PI PLISPRN THK9.1 MIL (GLOVES AND ACCESSORIES) IMPLANT
GLOVE SURG 8.5 LF PF BEAD CUF STRL CRM 11.8IN PROTEXIS PI (GLOVES AND ACCESSORIES)
GOWN SURG LRG STD LGTH REG L3 NONREINFORCE BRTHBL TWL STRL (DRAPE/PACKS/SHEETS/OR TOWEL) ×1
GOWN SURG LRG STD LGTH REG L3 NONREINFORCE BRTHBL TWL STRL LF  DISP BLU HALYARD SPECTRUM SMS (DRAPE/PACKS/SHEETS/OR TOWEL) ×1 IMPLANT
GOWN SURG XL STD LGTH L3 HKLP CLSR RGLN SLEEVE TWL STRL LF (DRAPE/PACKS/SHEETS/OR TOWEL)
GOWN SURG XL STD LGTH L3 HKLP CLSR RGLN SLEEVE TWL STRL LF  DISP GRN AERO BLU PRFRM FBRC (DRAPE/PACKS/SHEETS/OR TOWEL) IMPLANT
GOWN SURG XL STD LGTH L3 NONREINFORCE HKLP CLSR TWL STRL LF (DRAPE/PACKS/SHEETS/OR TOWEL)
GOWN SURG XL STD LGTH L3 NONREINFORCE HKLP CLSR TWL STRL LF  DISP BLU SPECTRUM SMS (DRAPE/PACKS/SHEETS/OR TOWEL)
GOWN SURG XL STD LGTH L3 NONREINFORCE HKLP CLSR TWL STRL LF DISP BLU SPECTRUM SMS (DRAPE/PACKS/SHEETS/OR TOWEL) IMPLANT
INSTR ORTHO 1.8MM SHAVER DRILL FLXB OBTURATOR (SURGICAL INSTRUMENTS) IMPLANT
LABEL MED CORRECT MED LABELING SYS 4 FLG 2 SHEET 24 PRPRNT (MED SURG SUPPLIES) ×1
LABEL MED CORRECT MED LABELING SYS 4 FLG 2 SHEET 24 PRPRNT STRL (MED SURG SUPPLIES) ×1
MAT FLR 110X29IN STD ABSRBENT WATERPROF BACKSHEET RL (MED SURG SUPPLIES) ×3 IMPLANT
NEEDLE HYPO  18GA 1.5IN STD REG BVL LF (MED SURG SUPPLIES) ×1
NEEDLE HYPO 18GA 1.5IN STD RE_G BVL LF (MED SURG SUPPLIES) ×1
NEEDLE SPINAL PNK 3.5IN 18GA QUINCKE REG WL POLYPROP QUINCKE TIP STRL LF  DISP (MED SURG SUPPLIES) ×1 IMPLANT
NEEDLE SPINAL PNK 3.5IN 18GA Q_UINCKE STD LGTH BVL FIT STY (MED SURG SUPPLIES) ×1
NEEDLE SUT SCORPION SUREFIRE ROTR CUF (SUTURE/WOUND CLOSURE) IMPLANT
NEEDLE SUT SCORPION SUREFIRE R_OTR CUF (SUTURE/WOUND CLOSURE)
PACK SURG ECLIPSE SHLDR PCH STRL DISP 114X77IN 100X66IN LF (DRAPE/PACKS/SHEETS/OR TOWEL) ×1 IMPLANT
PAD ABDOMINAL 7.5X8 STRL (WOUND CARE/ENTEROSTOMAL SUPPLY)
PAD ABDOMINAL 8X7.5IN LF  STRL (WOUND CARE SUPPLY) IMPLANT
PASSER SUTLASSO SD 90D UP STR 1.8MM 3.8MM SHLDR THB PAD SUT NITINOL LABRAL ASCP ROTR CUF REPR STRL (SUTURE/WOUND CLOSURE) IMPLANT
PASSER SUTLASSO SD 90D UP STR_1.8MM 3.8MM SHLDR THB PAD SUT (SUTURE/WOUND CLOSURE)
PMP TUBING 13FT CONT WV III DU_ALWAVE ARTHRO STRL DISP (MED SURG SUPPLIES) ×1
PROBE ESURG APOLLO RF 90D ML P_RT (CUTTING ELEMENTS) ×1
PROBE ESURG APOLLORF 90D ML PRT (SURGICAL CUTTING SUPPLIES) ×1 IMPLANT
PUMP TUBING 13FT CONT WV III DUALWAVE ARTHRO STRL DISP (MED SURG SUPPLIES) ×1 IMPLANT
SET TUBING DUALWAVE CASSETTE OFLW ASCP PUMP STRL DISP (ENDOSCOPIC SUPPLIES) ×1 IMPLANT
SLING ORTHO LRG 17.5X8.5IN ARM SHLDR PAD ENV BRTHBL HKLP (ORTHOPEDICS (NOT IMPLANTS)) ×1 IMPLANT
SOL IRRG LR 3L PRSV N-PYRG FLXB CONTAINR STRL LF (MEDICATIONS/SOLUTIONS) ×2 IMPLANT
SOL SURG PREP 26ML DRPRP 74% ISPRP 0.7% IOD POVACRYLEX SLF CNTN APPL SKIN STRL PREOP (MED SURG SUPPLIES) ×1 IMPLANT
SPONGE GAUZE 4X4IN MDCHC COTTON 12 PLY TY 7 LF  STRL DISP (WOUND CARE SUPPLY) ×1 IMPLANT
SPONGE GAUZE 4X4IN MDCHC COTTO_N 12 PLY TY 7 LF STRL DISP (WOUND CARE/ENTEROSTOMAL SUPPLY) ×1
SPONGE LAP 18X18IN PREWASH RIGID TRY STRL LF  WHT (MED SURG SUPPLIES) ×1 IMPLANT
SPONGE LAP 18X18IN PREWASH RIGID TRY STRL LF WHT (MED SURG SUPPLIES) ×1
SUTURE 4-0 C-14 SURGIPRO2 18IN BLU MONOF NONAB (SUTURE/WOUND CLOSURE) ×1 IMPLANT
SYRINGE LL 10ML LF  STRL GRAD N-PYRG DEHP-FR PVC FREE MED DISP (MED SURG SUPPLIES) ×1 IMPLANT
SYRINGE LL 10ML LF STRL MED D_ISP (MED SURG SUPPLIES) ×1
TOWEL 24X16IN COTTON BLU DISP SURG STRL LF (DRAPE/PACKS/SHEETS/OR TOWEL) ×4 IMPLANT
TUBING CONN 6MM X 3.7M (MED SURG SUPPLIES) ×3
TUBING EXT PIECE AR-6220 BX/20 (INSTRUMENTS ENDOMECHANICAL) ×1
TUBING IRRG 6FT DUALWAVE BKFL CK VALVE EXT STRL DISP (ENDOSCOPIC SUPPLIES) ×1 IMPLANT
TUBING SUCT CLR 12FT .25IN ARGYLE PVC NCDTV STR MALE FEMALE MLD CONN STRL LF (MED SURG SUPPLIES) ×3 IMPLANT
TUBING SUCT CLR 6FT .25IN ARGYLE PVC NCDTV STR MALE FEMALE (MED SURG SUPPLIES) ×1
TUBING SUCT CLR 6FT .25IN ARGYLE PVC NCDTV STR MALE FEMALE MLD CONN STRL LF (MED SURG SUPPLIES) ×1 IMPLANT

## 2021-12-10 NOTE — Discharge Instructions (Signed)
Follow up with Dr Lequita Halt on December 11,2023 at 10:10am    Keep shoulder in sling until follow up but may remove sling for hygiene purposes    If On Q pain pump is present, it will go flat in 2-3 days.  When the ball is completely flat, remove operative dressing and clear op site dressing and remove pain pump.  You will see a black tip at the end of the tubing to know that you have removed all of the tubing.  After the pain pump is out you can start daily showers.    Cover incisions with bandaids until follow up.    Exercise fingers during the healing process.      May use ice to operative shoulder 20 minutes on then 20 minutes off. DO NOT PLACE ICE DIRECTLY ON SKIN    No lifting, pulling, or tugging with surgical arm    Call office for problems, questions, concerns.    Resume home meds.

## 2021-12-10 NOTE — Anesthesia Transfer of Care (Signed)
ANESTHESIA TRANSFER OF CARE   Alec Friedman is a 51 y.o. ,male, Weight: 90.7 kg (200 lb)   had Procedure(s):  ARTHROSCOPIC SUBACROMIAL DECOMPRESSION WITH DEBRIDEMENT OF PARTIAL THICKNESS ROTATOR CUFF TEAR AND PLACEMENT OF ON Q PAIN MANAGE PUMP LEFT SHOULDER  performed  12/10/21   Primary Service: Quenton Fetter, DO    History reviewed. No pertinent past medical history.   Allergy History as of 12/10/21        No Known Allergies                  I completed my transfer of care / handoff to the receiving personnel during which we discussed:  All key/critical aspects of case discussed, Analgesia, Antibiotics, Fluids/Product, Gave opportunity for questions and acknowledgement of understanding and PMHx                              Additional Info:Resp regular, unlabored, vss, report to pacu RN to include that pt had a rash noted after induction, that benadryl and decadron were given, and that the rash resoved.                                      Last OR Temp: Temperature: 36.3 C (97.3 F)  ABG:  POTASSIUM   Date Value Ref Range Status   11/19/2021 4.7 3.5 - 5.1 mmol/L Final     KETONES   Date Value Ref Range Status   11/19/2021 Negative Negative, Trace mg/dL Final     CALCIUM   Date Value Ref Range Status   11/19/2021 8.8 8.6 - 10.3 mg/dL Final     Calculated P Axis   Date Value Ref Range Status   11/19/2021 62 degrees Final     Calculated R Axis   Date Value Ref Range Status   11/19/2021 70 degrees Final     Calculated T Axis   Date Value Ref Range Status   11/19/2021 46 degrees Final     Airway:  EndoTracheal Tube (Active)     Blood pressure 132/80, pulse 69, temperature 36.3 C (97.3 F), resp. rate 12, height 1.753 m (5\' 9" ), weight 90.7 kg (200 lb), SpO2 100%.

## 2021-12-10 NOTE — Interval H&P Note (Signed)
H & P updated the day of the procedure.  1.  H&P completed within 30 days of surgical procedure and has been reviewed within 24 hours of admission but prior to surgery or a procedure requiring anesthesia services, the patient has been examined, and no change has occured in the patients condition since the H&P was completed.       Change in medications: No        No LMP recorded.      Comments:     2.  Patient continues to be appropriate candidate for planned surgical procedure. YES    Alec Overbey, DO

## 2021-12-10 NOTE — OR Surgeon (Signed)
[facility]   Operative Note   PATIENT NAME:  Alec Friedman, Alec Friedman  MRN:  [id number]  DOB:  [birth year]    Date of Procedure:  12/10/2021  Preoperative Diagnosis: ROTATOR CUFF TEAR LEFT SHOULDER   Postoperative Diagnoses:  PARTIAL THICKNESS ROTATOR CUFF TEAR LEFT SHOULDER   Procedure Performed: Procedure(s) (LRB):  ARTHROSCOPIC SUBACROMIAL DECOMPRESSION WITH DEBRIDEMENT OF PARTIAL THICKNESS ROTATOR CUFF TEAR AND PLACEMENT OF ON Q PAIN MANAGE PUMP LEFT SHOULDER (Left)    Surgeon: Quenton Fetter, DO   Anesthesia: General  Estimated Blood Loss: Minimal  Complications: None immediate  Description of Procedure patient taken to the operating room given a general anesthetic and placed in the right lateral decubitus position with the left shoulder up appropriate padding and traction placed shorter prepped with DuraPrep draped in a sterile manner.  Posterior portal utilized glenohumeral joint revealed an intact inferior recess labrum biceps tendon and rotator cuff from the joint side.  Attention to the subacromial space.  Bursa resected the lateral portal subacromial decompression carried out by releasing the CA ligament with electrocautery anterior acromioplasty with the bur back to a flat anterior edge.  Careful inspection of the rotator cuff revealed an extensive partial-thickness tear which was gently debrided back to stable edges no full-thickness component appreciated.  Shoulder lavaged on Q pain catheter inserted injected with Naropin portals closed with 5 0 Prolene suture in an interrupted fashion sterile bandage and sling applied patient rolled supine awoken from anesthesia and brought to recovery in stable condition.   Quenton Fetter, DO   This note was partially generated using MModal Fluency Direct system, and there may be some incorrect words, spellings, and punctuation that were not noted in checking the note before saving.

## 2021-12-10 NOTE — H&P (Signed)
Paper H and P on chart, will be scanned to EMR.

## 2021-12-10 NOTE — Anesthesia Postprocedure Evaluation (Signed)
Anesthesia Post Op Evaluation    Patient: Alec Friedman  Procedure(s):  ARTHROSCOPIC SUBACROMIAL DECOMPRESSION WITH DEBRIDEMENT OF PARTIAL THICKNESS ROTATOR CUFF TEAR AND PLACEMENT OF ON Q PAIN MANAGE PUMP LEFT SHOULDER    Last Vitals:Temperature: 36.3 C (97.3 F) (12/10/21 1443)  Heart Rate: 64 (12/10/21 1500)  BP (Non-Invasive): 137/80 (12/10/21 1500)  Respiratory Rate: 19 (12/10/21 1500)  SpO2: 99 % (12/10/21 1500)    There were no known notable events for this encounter.    Patient is sufficiently recovered from the effects of anesthesia to participate in the evaluation and has returned to their pre-procedure level.  Patient location during evaluation: PACU       Patient participation: complete - patient participated  Level of consciousness: awake and alert and responsive to verbal stimuli    Pain management: adequate  Airway patency: patent    Anesthetic complications: no  Cardiovascular status: acceptable  Respiratory status: acceptable  Hydration status: acceptable  Patient post-procedure temperature: Pt Normothermic   PONV Status: Absent

## 2021-12-10 NOTE — Anesthesia Preprocedure Evaluation (Signed)
ANESTHESIA PRE-OP EVALUATION  Planned Procedure: ARTHROSCOPIC SUBACROMIAL DECOMPRESSION WITH ROTATOR CUFF REPAIR LEFT SHOULDER (Left: Shoulder)  Review of Systems     anesthesia history negative     patient summary reviewed  nursing notes reviewed        Pulmonary  negative pulmonary ROS,    Cardiovascular    ECG reviewed ,No peripheral edema,  Exercise Tolerance: > or = 4 METS        GI/Hepatic/Renal   negative GI/hepatic/renal ROS,         Endo/Other   neg endo/other ROS,       Neuro/Psych/MS   negative neuro/psych ROS,      Cancer                        Physical Assessment      Airway       Mallampati: II    TM distance: >3 FB    Neck ROM: full  Mouth Opening: good.  Facial hair  Beard        Dental           (+) edentulous           Pulmonary    Breath sounds clear to auscultation  (-) no rhonchi, no decreased breath sounds, no wheezes, no rales and no stridor     Cardiovascular    Rhythm: regular  Rate: Normal  (-) no friction rub, carotid bruit is not present, no peripheral edema and no murmur     Other findings              Plan  ASA 1     Planned anesthesia type: general         plan to administer opioids postoperatively            PONV/POV Plan:  I plan to administer pharmcologic prophalaxis antiemetics  Intravenous induction     Anesthesia issues/risks discussed are: Post-op Pain Management, Stroke, Aspiration, Cardiac Events/MI, Blood Loss, PONV and Sore Throat.  Anesthetic plan and risks discussed with patient  signed consent obtained            Patient's NPO status is appropriate for Anesthesia.           Plan discussed with CRNA.

## 2022-01-15 IMAGING — DX XRAY ABDOMEN 1 VIEW
1 series · 2 of 2 positions shown · non-contrast
Comparison: Radiographic study dated 09/24/2021.

﻿EXAM:  72352   XRAY ABDOMEN 1 VIEW
INDICATION: History of dysuria. Hematuria.  Left lower pain.  51-year-old male.
TECHNIQUE: Supine radiographs of the abdomen.

[Series 1: apsupine · 0.14mm/px · 2 of 2 slices shown]
[im 1/2]
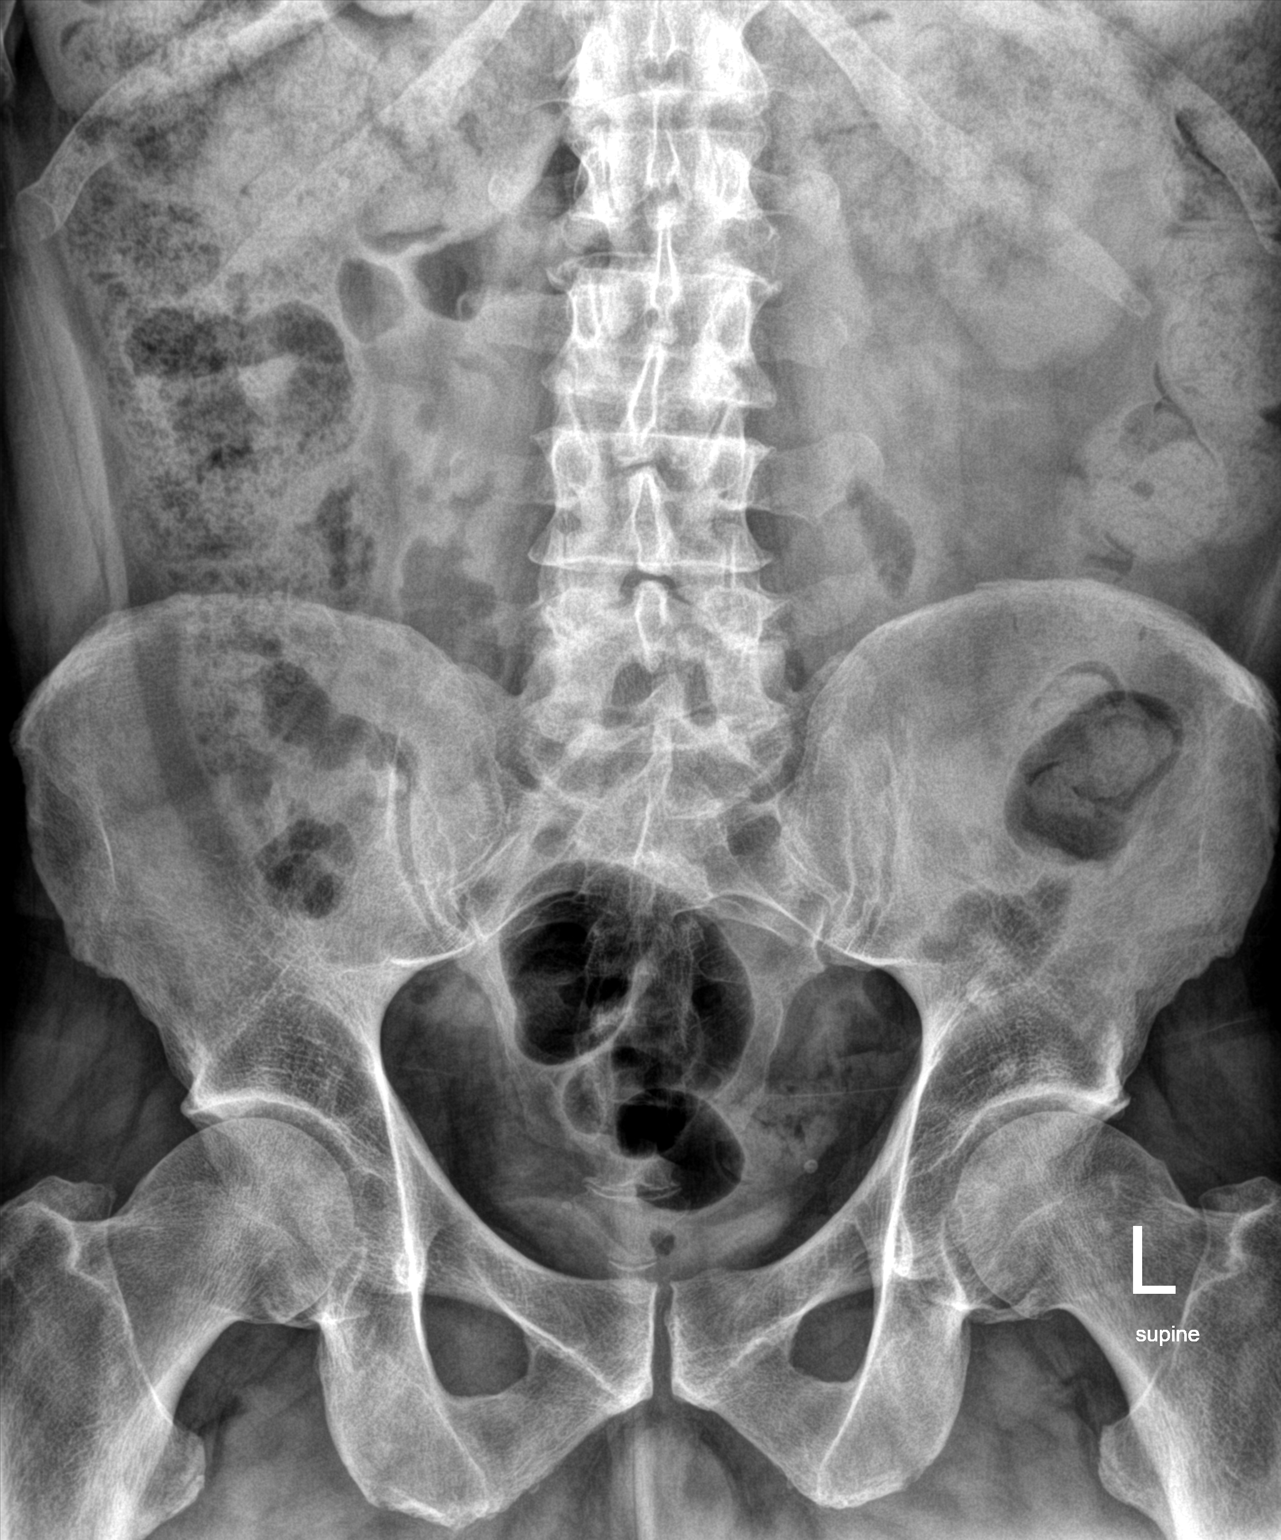
[im 2/2]
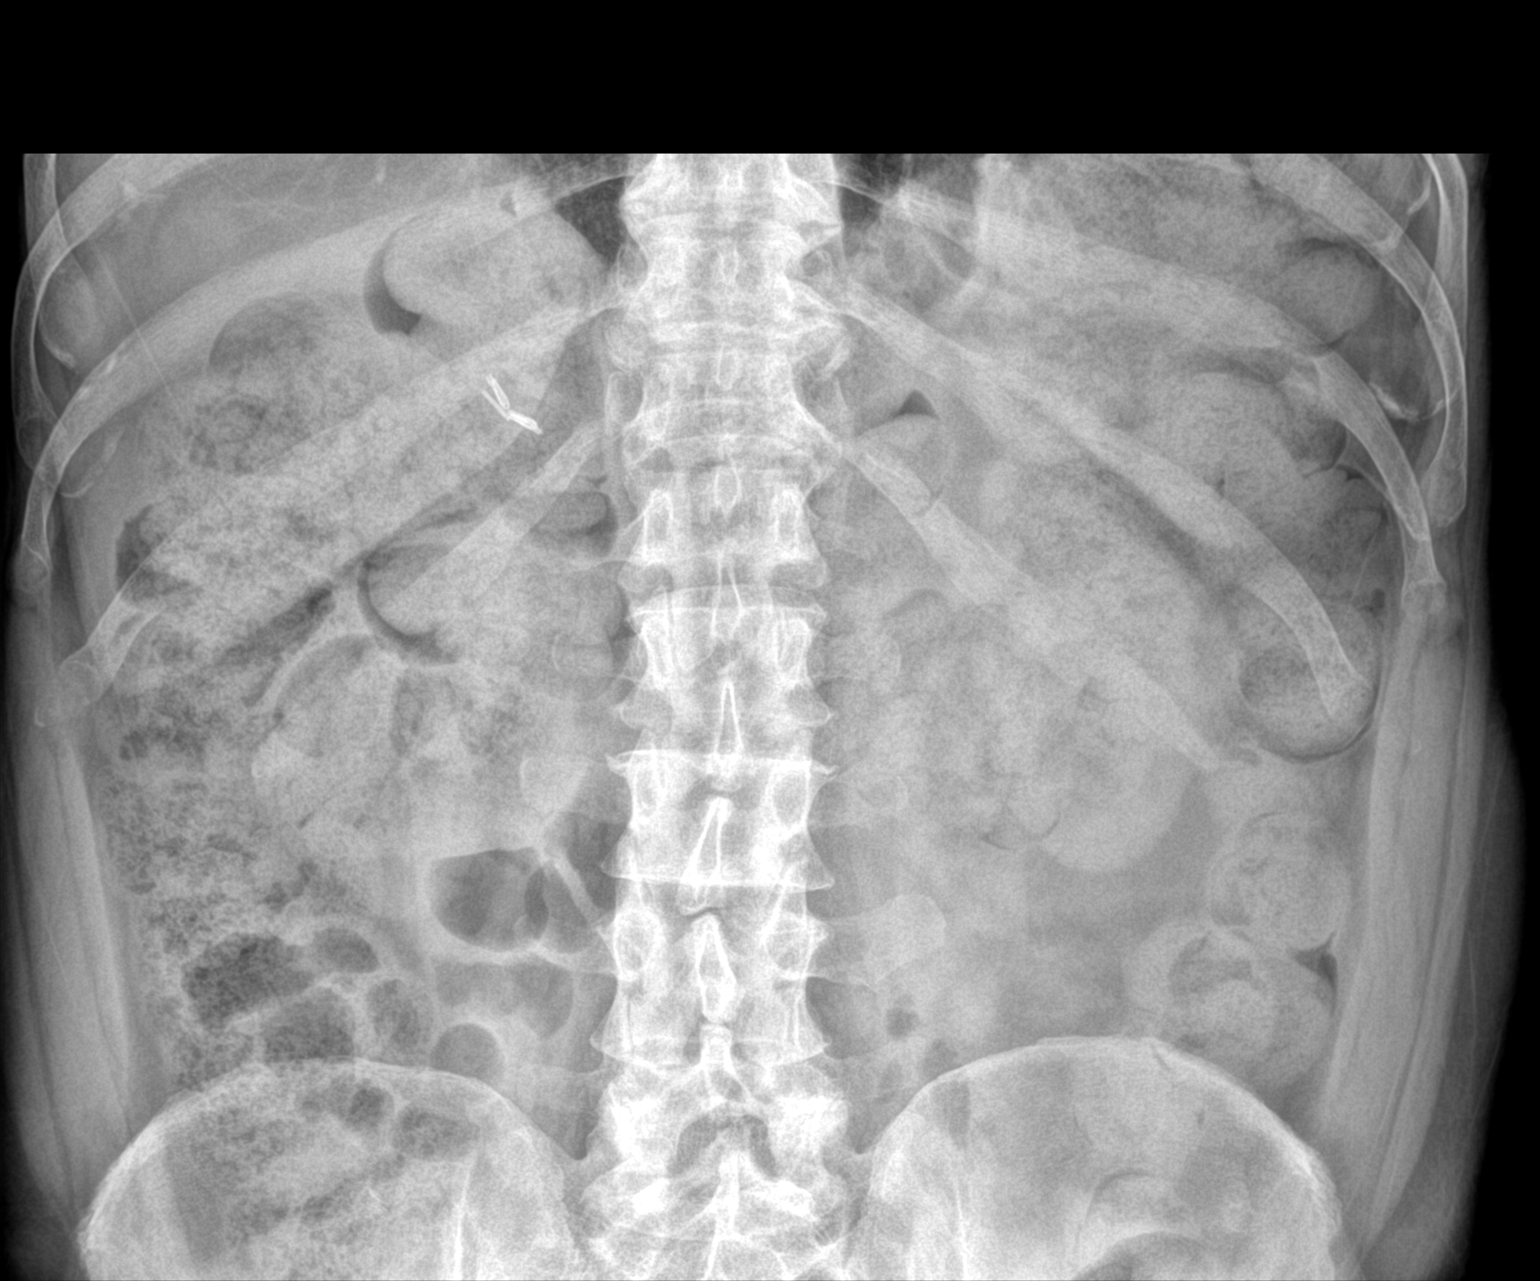

[2 of 2 positions shown; findings below may reference images not displayed]

FINDINGS: No abnormal calcific densities in the projection of kidneys and bladder are noted.  Evaluation of the kidneys is limited due to fecal impaction of the colon superimposed.  Postsurgical changes of cholecystectomy.
IMPRESSION: No definite abnormal calcific densities are seen. Fecal impaction is noted. Limited visualization of kidneys due to fecal impaction.  If symptoms persist, more definitive evaluation by CT scan with and without contrast is recommended.

## 2022-03-13 IMAGING — MR MRI KNEE RT W/O CONTRAST
5 series · 40 of 40 positions shown · non-contrast
Comparison: None available.

﻿EXAM:  [id number]   MRI KNEE RT W/O CONTRAST
INDICATION: Chronic right knee pain.
TECHNIQUE: Noncontrast multiplanar, multisequence MRI was performed.

[Series 5: PD fat-sat · axial · right · 4.0mm · 0.53mm/px · z∈[-64,+66]mm · 9 of 30 slices shown (1 of 3)]
[im 1/30]
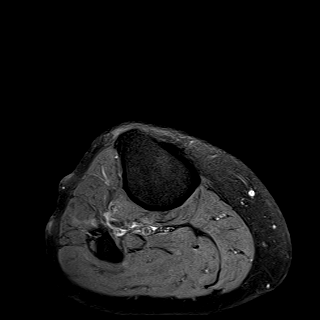
[im 4/30]
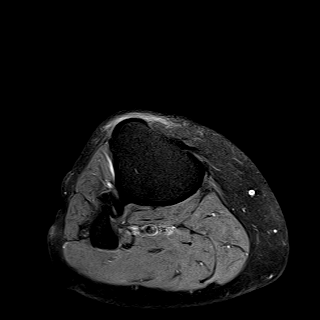
[im 8/30]
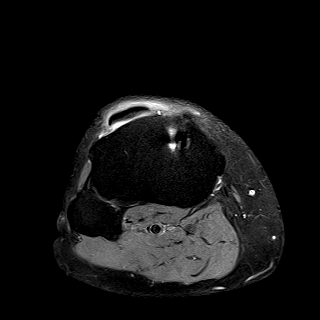
[im 11/30]
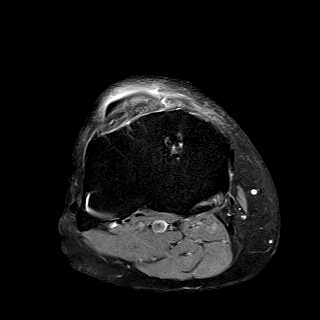
[im 15/30]
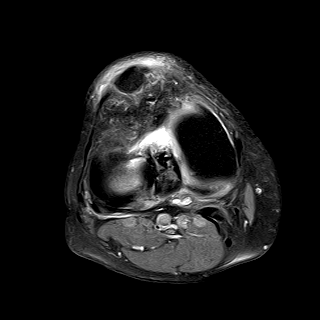
[im 19/30]
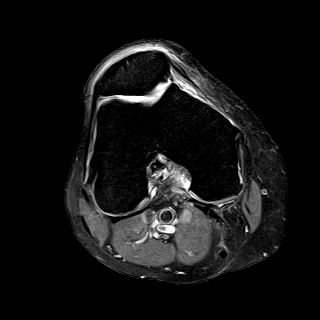
[im 22/30]
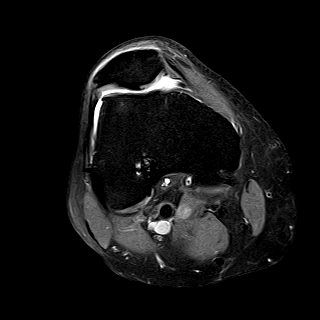
[im 26/30]
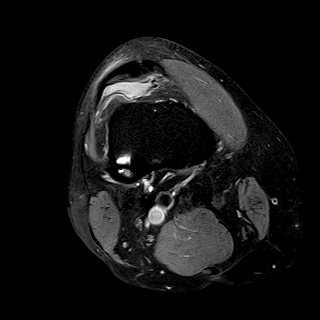
[im 30/30]
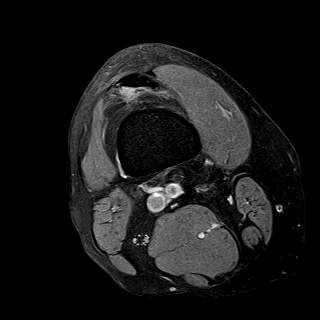

[Series 6: PD fat-sat · sagittal · right · 3.0mm · 0.47mm/px · 9 of 30 slices shown (2 of 3)]
[im 1/30]
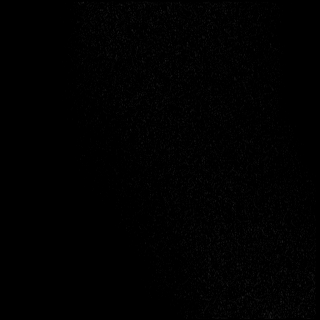
[im 4/30]
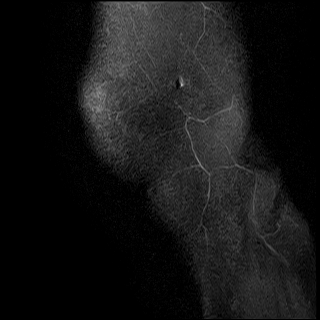
[im 8/30]
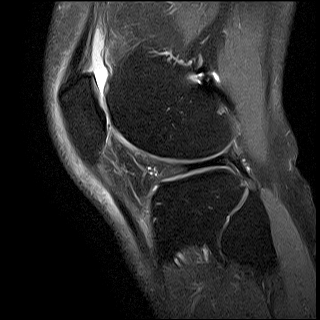
[im 11/30]
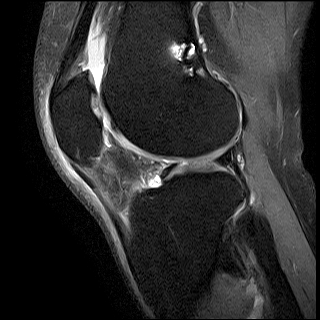
[im 15/30]
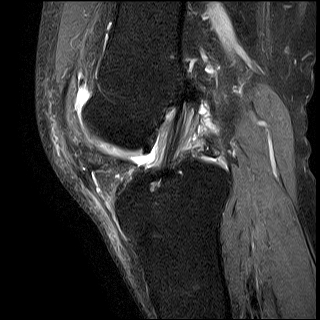
[im 19/30]
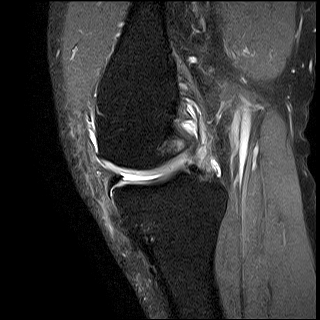
[im 22/30]
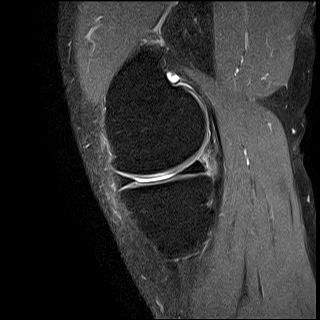
[im 26/30]
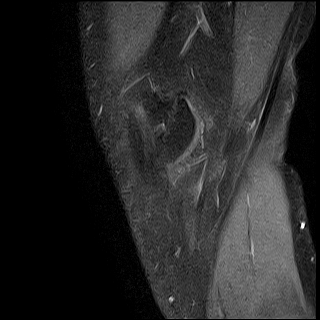
[im 30/30]
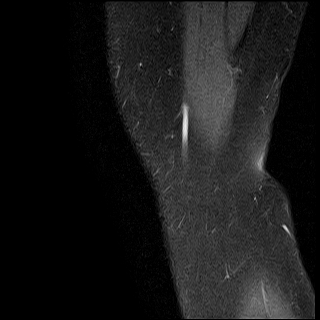

[Series 7: T1 · sagittal · right · 3.0mm · 0.39mm/px · 8 of 30 slices shown]
[im 1/30]
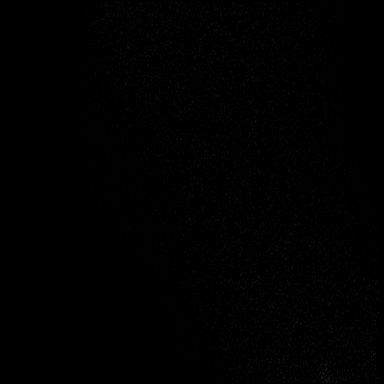
[im 5/30]
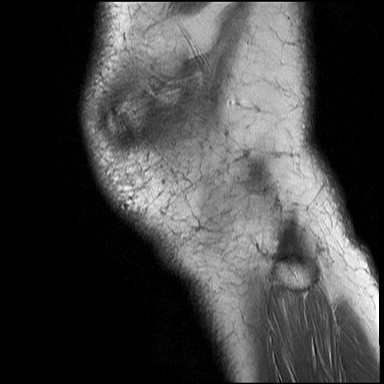
[im 9/30]
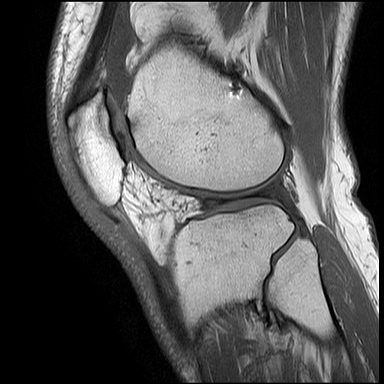
[im 13/30]
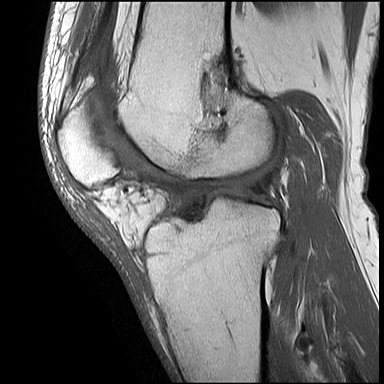
[im 17/30]
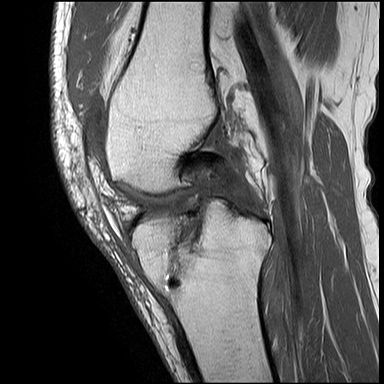
[im 21/30]
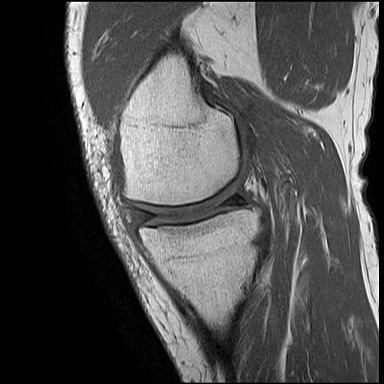
[im 25/30]
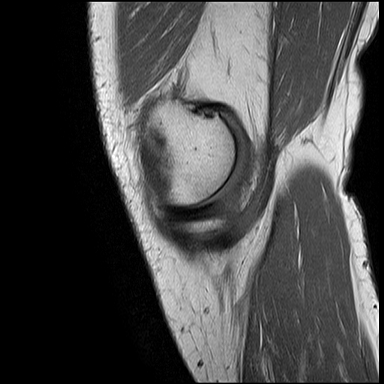
[im 30/30]
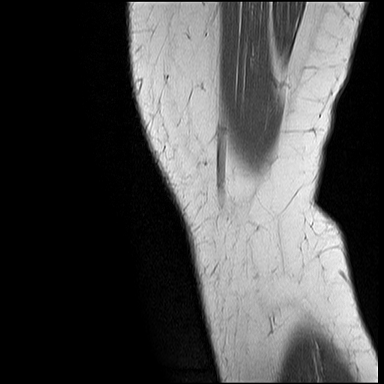

[Series 8: STIR · coronal · right · 3.5mm · 0.47mm/px · 7 of 26 slices shown]
[im 1/26]
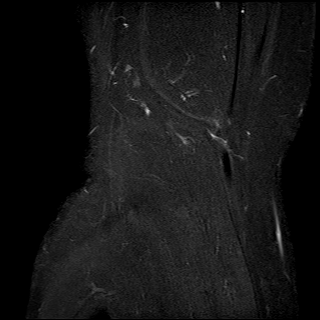
[im 5/26]
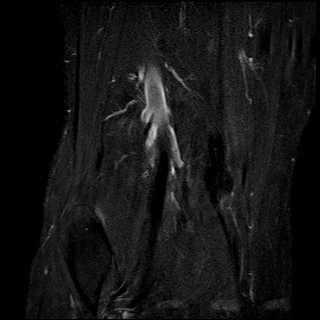
[im 9/26]
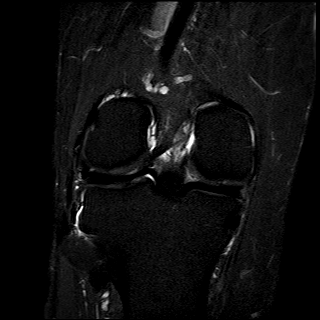
[im 13/26]
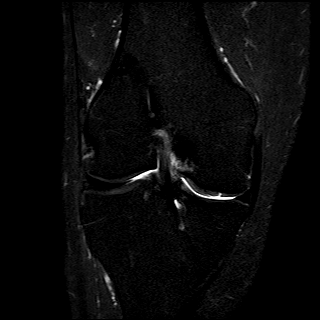
[im 17/26]
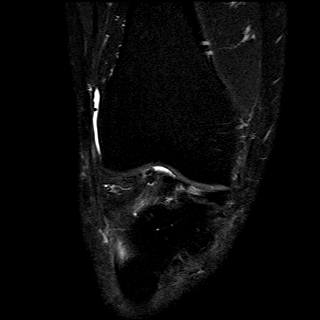
[im 21/26]
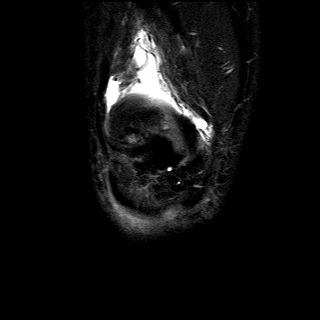
[im 26/26]
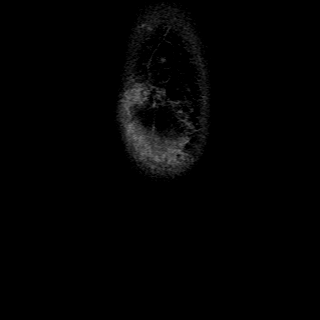

[Series 9: PD fat-sat · coronal · right · 3.5mm · 0.47mm/px · 7 of 26 slices shown (3 of 3)]
[im 1/26]
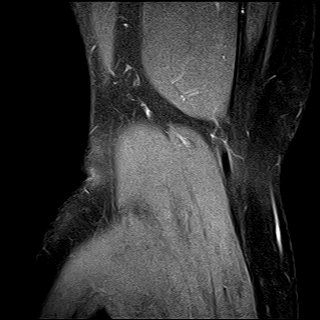
[im 5/26]
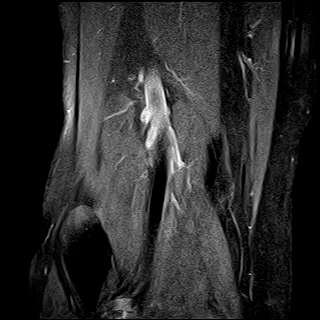
[im 9/26]
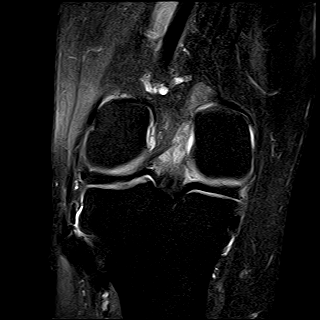
[im 13/26]
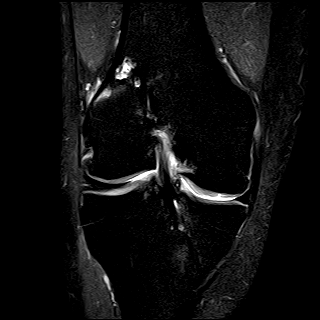
[im 17/26]
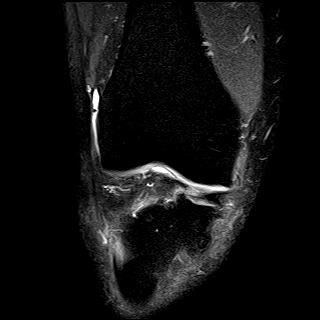
[im 21/26]
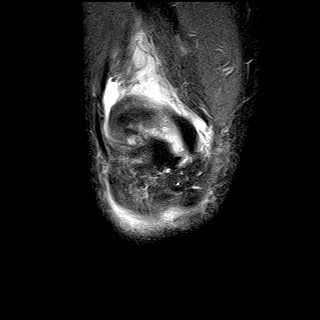
[im 26/26]
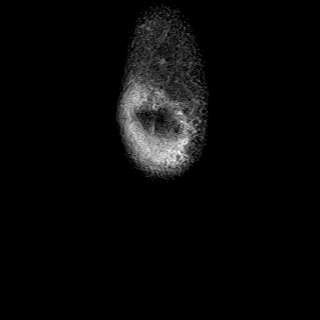

[40 of 40 positions shown; findings below may reference images not displayed]

FINDINGS: Postsurgical changes are noted compatible with ACL repair.  The graft appears intact.  

There is a small joint effusion.  

There is moderate patellar tendinosis with a small amount of adjacent edema and trace fluid. 

There is grade 3 chondromalacia patella.  

The menisci, posterior cruciate ligament, and collateral ligaments appear intact.  There is no acute fracture, dislocation, or bone contusion.  There are mild degenerative changes.
IMPRESSION: 1. Intact ACL graft. 

2. Patellar tendinosis.

3. Grade 3 chondromalacia patella.

## 2022-04-14 ENCOUNTER — Encounter (HOSPITAL_COMMUNITY): Payer: Self-pay

## 2022-04-14 ENCOUNTER — Other Ambulatory Visit (HOSPITAL_COMMUNITY): Payer: Self-pay

## 2022-04-14 DIAGNOSIS — M961 Postlaminectomy syndrome, not elsewhere classified: Secondary | ICD-10-CM

## 2022-04-14 DIAGNOSIS — M48062 Spinal stenosis, lumbar region with neurogenic claudication: Secondary | ICD-10-CM

## 2022-04-14 DIAGNOSIS — M48061 Spinal stenosis, lumbar region without neurogenic claudication: Secondary | ICD-10-CM

## 2022-04-14 DIAGNOSIS — M549 Dorsalgia, unspecified: Secondary | ICD-10-CM

## 2022-04-14 DIAGNOSIS — M5416 Radiculopathy, lumbar region: Secondary | ICD-10-CM

## 2022-04-14 DIAGNOSIS — M5410 Radiculopathy, site unspecified: Secondary | ICD-10-CM

## 2022-04-25 ENCOUNTER — Telehealth (HOSPITAL_COMMUNITY): Payer: Self-pay | Admitting: Vascular & Interventional Radiology

## 2022-04-25 NOTE — OR PreOp (Signed)
Spoke with patient regarding IR procedure on 04/28/22.  NKDA.  Not taking any blood thinners or injection medications.  No previous reaction to sedation or family history related anesthesia.  Instructed patient to be NPO after midnight and must have someone to drive him home after the procedure.

## 2022-04-25 NOTE — OR PreOp (Signed)
LEFT MESSAGE FOR PATIENT TO CALL REGARDING IR PROCEDURE ON 04/28/22

## 2022-04-28 ENCOUNTER — Encounter (HOSPITAL_COMMUNITY): Payer: Self-pay

## 2022-04-28 ENCOUNTER — Other Ambulatory Visit: Payer: Self-pay

## 2022-04-28 ENCOUNTER — Inpatient Hospital Stay
Admission: RE | Admit: 2022-04-28 | Discharge: 2022-04-28 | Disposition: A | Discharge status: Home / Self Care | Payer: Medicare Other | Source: Ambulatory Visit

## 2022-04-28 DIAGNOSIS — M48061 Spinal stenosis, lumbar region without neurogenic claudication: Secondary | ICD-10-CM

## 2022-04-28 DIAGNOSIS — M5410 Radiculopathy, site unspecified: Secondary | ICD-10-CM

## 2022-04-28 DIAGNOSIS — M5416 Radiculopathy, lumbar region: Secondary | ICD-10-CM

## 2022-04-28 DIAGNOSIS — M961 Postlaminectomy syndrome, not elsewhere classified: Secondary | ICD-10-CM

## 2022-04-28 DIAGNOSIS — M549 Dorsalgia, unspecified: Secondary | ICD-10-CM

## 2022-04-28 DIAGNOSIS — M545 Low back pain, unspecified: Secondary | ICD-10-CM | POA: Insufficient documentation

## 2022-04-28 HISTORY — DX: Muscle weakness (generalized): M62.81

## 2022-04-28 HISTORY — DX: Deficiency of other specified B group vitamins: E53.8

## 2022-04-28 MED ORDER — LIDOCAINE HCL 20 MG/ML (2 %) INJECTION SOLUTION
Freq: Once | INTRAMUSCULAR | Status: AC | PRN
Start: 2022-04-28 — End: 2022-04-28
  Administered 2022-04-28: 5 mL via INTRADERMAL
  Administered 2022-04-28: 1 mL via INTRADERMAL

## 2022-04-28 MED ORDER — LIDOCAINE (PF) 20 MG/ML (2 %) INJECTION SOLUTION
INTRAMUSCULAR | Status: AC
Start: 2022-04-28 — End: 2022-04-28
  Filled 2022-04-28: qty 5

## 2022-04-28 MED ORDER — DEXAMETHASONE SODIUM PHOSPHATE (PF) 10 MG/ML INJECTION SOLUTION
INTRAMUSCULAR | Status: AC
Start: 2022-04-28 — End: 2022-04-28
  Filled 2022-04-28: qty 1

## 2022-04-28 MED ORDER — DEXAMETHASONE SODIUM PHOSPHATE (PF) 10 MG/ML INJECTION SOLUTION
Freq: Once | INTRAMUSCULAR | Status: AC | PRN
Start: 2022-04-28 — End: 2022-04-28
  Administered 2022-04-28: 10 mg via INTRAMUSCULAR

## 2022-04-28 MED ORDER — IOHEXOL 350 MG IODINE/ML INTRAVENOUS SOLUTION
5.0000 mL | INTRAVENOUS | Status: AC
Start: 2022-04-28 — End: 2022-04-28
  Administered 2022-04-28: 5 mL via INTRA_ARTICULAR

## 2022-04-28 MED ORDER — LIDOCAINE HCL 20 MG/ML (2 %) INJECTION SOLUTION
INTRAMUSCULAR | Status: AC
Start: 2022-04-28 — End: 2022-04-28
  Filled 2022-04-28: qty 20

## 2022-04-28 NOTE — Discharge Instructions (Addendum)
SURGICAL DISCHARGE INSTRUCTIONS     Dr Dortha Schwalbe PERFORMED YOUR EPIDURAL SPINAL INJECTION IN THE RADIOLOGY DEPT AT [facility]      [facility]:  Monday through Friday from 8 a.m. - 4 p.m.    For T&D: [phone]  Between 4 p.m. - 8 a.m., weekends and holidays:  Call ER [phone]    PLEASE SEE WRITTEN HANDOUTS AS DISCUSSED BY YOUR NURSE:     SIGNS AND SYMPTOMS OF A WOUND / INCISION INFECTION   Be sure to watch for the following:  Increase in redness or red streaks near or around the wound or incision.  Increase in pain that is intense or severe and cannot be relieved by the pain medication that your doctor has given you.  Increase in swelling that cannot be relieved by elevation of a body part, or by applying ice, if permitted.  Increase in drainage, or if yellow / green in color and smells bad. This could be on a dressing or a cast.  Increase in fever for longer than 24 hours, or an increase that is higher than 101 degrees Fahrenheit (normal body temperature is 98 degrees Fahrenheit). The incision may feel warm to the touch.    **CALL YOUR DOCTOR IF ONE OR MORE OF THESE SIGNS / SYMPTOMS SHOULD OCCUR.    ANESTHESIA INFORMATION   LOCAL ANESTHETIC:  You have receieved a local anesthetic, the effects should disappear in a few hours.    REMEMBER   If you experience any difficulty breathing, chest pain, bleeding that you feel is excessive, persistent nausea or vomiting or for any other concerns:  Call DR GROTEN at [phone] . You may also ask to have the general doctor on call paged. They are available to you 24 hours a day.          FOLLOW-UP APPOINTMENTS   PLEASE KEEP YOUR FOLLOW UP APPOINTMENT ALREADY SCHEDULED WITH DR Marella Chimes.

## 2022-04-28 NOTE — Brief Op Note (Signed)
04/28/22      Radiologist: Dr. Neomia Dear    Procedure: PAIN CL FL EPIDURAL STEROID INJECTION LUMBAR    Description of Procedure Findings:      Lumbar stenosis        Estimated Blood Loss:  1.0 mL        Diagnosis: Lumbar stenosis  Back pain, unspecified back location, unspecified back pain laterality, unspecified chronicity  Radiculopathy, unspecified spinal region  Post laminectomy syndrome; as above; dictated report to follow      Neomia Dear, MD

## 2022-09-10 ENCOUNTER — Other Ambulatory Visit: Payer: Self-pay

## 2022-09-10 ENCOUNTER — Emergency Department
Admission: EM | Admit: 2022-09-10 | Discharge: 2022-09-10 | Disposition: A | Discharge status: Home / Self Care | Payer: Medicare HMO | Attending: Emergency Medicine | Admitting: Emergency Medicine

## 2022-09-10 ENCOUNTER — Encounter (HOSPITAL_BASED_OUTPATIENT_CLINIC_OR_DEPARTMENT_OTHER): Payer: Self-pay

## 2022-09-10 DIAGNOSIS — W5511XA Bitten by horse, initial encounter: Secondary | ICD-10-CM | POA: Insufficient documentation

## 2022-09-10 DIAGNOSIS — Z23 Encounter for immunization: Secondary | ICD-10-CM | POA: Insufficient documentation

## 2022-09-10 DIAGNOSIS — S61251A Open bite of left index finger without damage to nail, initial encounter: Secondary | ICD-10-CM | POA: Insufficient documentation

## 2022-09-10 DIAGNOSIS — Z203 Contact with and (suspected) exposure to rabies: Secondary | ICD-10-CM | POA: Insufficient documentation

## 2022-09-10 DIAGNOSIS — Z2914 Encounter for prophylactic rabies immune globin: Secondary | ICD-10-CM | POA: Insufficient documentation

## 2022-09-10 DIAGNOSIS — S61239A Puncture wound without foreign body of unspecified finger without damage to nail, initial encounter: Secondary | ICD-10-CM

## 2022-09-10 HISTORY — DX: Dorsalgia, unspecified: M54.9

## 2022-09-10 MED ORDER — RABIES VACCINE,HUMAN DIPLOID (PF) 2.5 UNIT INTRAMUSCULAR SOLUTION
INTRAMUSCULAR | Status: AC
Start: 2022-09-10 — End: 2022-09-10
  Filled 2022-09-10: qty 1

## 2022-09-10 MED ORDER — RABIES IMMUNE GLOBULIN (PF) 300 UNIT/ML INTRAMUSCULAR SOLUTION
INTRAMUSCULAR | Status: AC
Start: 2022-09-10 — End: 2022-09-10
  Filled 2022-09-10: qty 5

## 2022-09-10 MED ORDER — RABIES IMMUNE GLOBULIN (PF) 300 UNIT/ML INTRAMUSCULAR SOLUTION
INTRAMUSCULAR | Status: AC
Start: 2022-09-10 — End: 2022-09-10
  Filled 2022-09-10: qty 1

## 2022-09-10 MED ORDER — RABIES IMMUNE GLOBULIN (PF) 300 UNIT/ML INTRAMUSCULAR SOLUTION
20.0000 [IU]/kg | Freq: Once | INTRAMUSCULAR | Status: AC
Start: 2022-09-10 — End: 2022-09-10
  Administered 2022-09-10: 1815 [IU]

## 2022-09-10 MED ORDER — RABIES VACCINE,HUMAN DIPLOID (PF) 2.5 UNIT INTRAMUSCULAR SOLUTION
2.5000 [IU] | Freq: Once | INTRAMUSCULAR | Status: AC
Start: 2022-09-10 — End: 2022-09-10
  Administered 2022-09-10: 2.5 [IU] via INTRAMUSCULAR

## 2022-09-10 NOTE — ED Provider Notes (Signed)
[facility], [facility] Emergency Department  ED Primary Provider Note  History of Present Illness   Chief Complaint   Patient presents with    Animal Bite    Leg Pain     Alec Friedman is a 52 y.o. male who had concerns including Animal Bite and Leg Pain.  Arrival: The patient arrived by Car patient was bitten by his own course 8 days ago on his left hand.  Has slight break in the skin over the 2nd MCP area.  He states that is healed up now.  There is no drainage or redness to his hand.  The horse any other hand died at [employer].  They think the horse has rabies in the head has been sent off to the state for further examination.  In the meantime the vet told him to get rabies vaccine and immunoglobulin as soon as he can.  Patient states feeling fine.  He has no myalgias or arthralgias.  He has no fever or chills.    HPI  Review of Systems   Review of Systems   Constitutional:  Positive for activity change. Negative for chills and fever.   HENT:  Negative for ear pain and sore throat.    Eyes:  Negative for pain and visual disturbance.   Respiratory:  Negative for cough and shortness of breath.    Cardiovascular:  Negative for chest pain and palpitations.   Gastrointestinal:  Negative for abdominal pain and vomiting.   Genitourinary:  Negative for dysuria and hematuria.   Musculoskeletal:  Negative for arthralgias and back pain.   Skin:  Positive for wound. Negative for color change and rash.   Neurological:  Negative for seizures and syncope.   All other systems reviewed and are negative.     Historical Data   History Reviewed This Encounter:     Physical Exam   ED Triage Vitals [09/10/22 0826]   BP (Non-Invasive) 119/72   Heart Rate 75   Respiratory Rate 18   Temperature 36.2 C (97.1 F)   SpO2 96 %   Weight 90.7 kg (200 lb)   Height      Physical Exam  Vitals and nursing note reviewed.   Constitutional:       General: He is not in acute distress.     Appearance:  Normal appearance. He is well-developed and normal weight.   HENT:      Head: Normocephalic and atraumatic.      Right Ear: External ear normal.      Left Ear: External ear normal.      Nose: Nose normal.      Mouth/Throat:      Mouth: Mucous membranes are dry.   Eyes:      Extraocular Movements: Extraocular movements intact.      Conjunctiva/sclera: Conjunctivae normal.      Pupils: Pupils are equal, round, and reactive to light.   Cardiovascular:      Rate and Rhythm: Normal rate and regular rhythm.      Pulses: Normal pulses.      Heart sounds: Normal heart sounds. No murmur heard.  Pulmonary:      Effort: Pulmonary effort is normal. No respiratory distress.      Breath sounds: Normal breath sounds.   Abdominal:      General: Bowel sounds are normal.      Palpations: Abdomen is soft.      Tenderness: There is no abdominal tenderness.  Musculoskeletal:         General: Tenderness and signs of injury present. No swelling. Normal range of motion.      Cervical back: Normal range of motion and neck supple.      Comments: Positive puncture wound on the left dorsum of the hand over the 2nd metacarpal area.  No erythema or drainage.  No swelling or ecchymosis.   Skin:     General: Skin is warm and dry.      Capillary Refill: Capillary refill takes less than 2 seconds.   Neurological:      General: No focal deficit present.      Mental Status: He is alert and oriented to person, place, and time.   Psychiatric:         Mood and Affect: Mood normal.         Behavior: Behavior normal.         Thought Content: Thought content normal.       Patient Data   Labs Ordered/Reviewed - No data to display  No orders to display     Medical Decision Making        Medical Decision Making  Patient is 52 year old white male complaining of being bitten by his horse on his left hand 8 days ago.  The horse has since been to the vet and [place] [facility] and found to have rabies.  The it immediately told the patient to get  vaccinated and get immunoglobulin.  He stated he would do it today.  Patient states he feels perfectly fine.  His hand is healed with no tenderness.  He denies any drainage from the area.  He has no myalgias or arthralgias.  He does not have any fever or chills.  Patient will receive 1st vaccine as well as immunoglobulin today.  Will then return on day 3, day 7, and day 14 for vaccinations.    Risk  Prescription drug management.             Medications Administered in the ED   rabies virus vaccine (IMOVAX) IM injection (2.5 Units IntraMUSCULAR Given 09/10/22 1000)   rabies immune globulin (PF) 300 units/mL IM injection (1,815 Units Infiltration Given 09/10/22 0903)     Clinical Impression   Horse bite (Primary)   Puncture wound of finger of left hand   Need for rabies vaccination       Disposition: Discharged               Clinical Impression   Horse bite (Primary)   Puncture wound of finger of left hand   Need for rabies vaccination       Current Discharge Medication List

## 2022-09-10 NOTE — ED Triage Notes (Signed)
Pt bit by his 6 days ago. Horse was taken to [facility]. Vet felt that the horse has rabies. Pt was told by Vet to go to ER for Rabies Vaccine. Pt also c/o's right leg pain.

## 2022-09-10 NOTE — ED Nurses Note (Signed)
Patient reports being bitten by his horse 8 days ago, slight break in skin which patient states has healed. No redness/drainage. Patient reports horse has possible rabies, was sent by vet for rabies vaccine and immunoglobulin. Denies any fever/chills.

## 2022-09-10 NOTE — ED Nurses Note (Signed)
Patient given written and verbal d/c instructions. All questions/concerns addressed. Informed to return with any concerns. Informed to return 8/31 for 2nd rabies vaccine. Patient verbalizes understanding. Patient leaving ambulatory at this time.

## 2022-09-13 ENCOUNTER — Other Ambulatory Visit: Payer: Self-pay

## 2022-09-13 ENCOUNTER — Encounter (HOSPITAL_BASED_OUTPATIENT_CLINIC_OR_DEPARTMENT_OTHER): Payer: Self-pay

## 2022-09-13 ENCOUNTER — Emergency Department
Admission: EM | Admit: 2022-09-13 | Discharge: 2022-09-13 | Disposition: A | Discharge status: Home / Self Care | Payer: Medicare HMO | Attending: Emergency Medicine | Admitting: Emergency Medicine

## 2022-09-13 DIAGNOSIS — Z203 Contact with and (suspected) exposure to rabies: Secondary | ICD-10-CM | POA: Insufficient documentation

## 2022-09-13 DIAGNOSIS — Z23 Encounter for immunization: Secondary | ICD-10-CM | POA: Insufficient documentation

## 2022-09-13 DIAGNOSIS — T50B95A Adverse effect of other viral vaccines, initial encounter: Secondary | ICD-10-CM

## 2022-09-13 MED ORDER — RABIES VACCINE,HUMAN DIPLOID (PF) 2.5 UNIT INTRAMUSCULAR SOLUTION
2.5000 [IU] | Freq: Once | INTRAMUSCULAR | Status: AC
Start: 2022-09-13 — End: 2022-09-13
  Administered 2022-09-13: 2.5 [IU] via INTRAMUSCULAR

## 2022-09-13 MED ORDER — RABIES VACCINE,HUMAN DIPLOID (PF) 2.5 UNIT INTRAMUSCULAR SOLUTION
INTRAMUSCULAR | Status: AC
Start: 2022-09-13 — End: 2022-09-13
  Filled 2022-09-13: qty 1

## 2022-09-13 NOTE — ED Triage Notes (Signed)
Pt reports here to get his second rabies vaccine . Denies any complaints

## 2022-09-13 NOTE — ED Provider Notes (Signed)
[facility], [facility] Emergency Department  ED Primary Provider Note  HPI:  Alec Friedman is a 52 y.o. male     Patient presents for rabies vaccine.    ROS review and negative aside from stated in HPI.    Physical Exam:  ED Triage Vitals [09/13/22 0308]   BP (Non-Invasive) 126/79   Heart Rate 77   Respiratory Rate 19   Temperature (!) 35.7 C (96.2 F)   SpO2 97 %   Weight 90.7 kg (200 lb)   Height 1.753 m (5\' 9" )     No acute distress.  Patient awake alert oriented x3.  Mood is appropriate.  Pupils 3 mm equal round reactive.  Extraocular movements are intact.  Oropharynx is clear.  Mucous membranes moist.  Trachea midline.  Neck is supple.  Heart has regular rate and rhythm without significant murmurs rubs or gallops.  Lungs are clear to auscultation.  Abdomen soft nontender, nondistended.  Moving all extremities without difficulty.  No rash no edema.      Patient data:  Labs Ordered/Reviewed - No data to display  No orders to display       MDM:    Patient received rabies vaccine.  Will have him continue along the series.      Discharged  Clinical Impression   Adverse effect of rabies vaccine, initial encounter (Primary)     Medications Administered in the ED   rabies virus vaccine (IMOVAX) IM injection (2.5 Units IntraMUSCULAR Given 09/13/22 0345)        Current Discharge Medication List        CONTINUE these medications - NO CHANGES were made during your visit.        Details   ALPRAZolam 0.5 mg Tablet  Commonly known as: XANAX   0.5 mg, Oral, 3 TIMES DAILY  Refills: 0     Belsomra 10 mg Tablet  Generic drug: suvorexant   1 Tablet, Oral, NIGHTLY PRN  Refills: 0     buPROPion 150 mg tablet sustained-release 12 hr  Commonly known as: WELLBUTRIN SR   150 mg, Oral, 2 TIMES DAILY  Refills: 0     cyanocobalamin 1,000 mcg/mL Solution  Commonly known as: VITAMIN B12   1,000 mcg, Subcutaneous, EVERY 14 DAYS  Refills: 0     gabapentin 800 mg Tablet  Commonly known as: NEURONTIN   800 mg,  Oral, 3 TIMES DAILY  Refills: 0     HYDROcodone-acetaminophen 10-325 mg Tablet  Commonly known as: NORCO   1 Tablet, Oral, EVERY 6 HOURS PRN  Refills: 0     lactulose 10 gram/15 mL Solution  Commonly known as: ENULOSE   30 mL, Oral, 2 TIMES DAILY PRN  Refills: 0     meloxicam 15 mg Tablet  Commonly known as: MOBIC   15 mg, Oral, DAILY  Refills: 0     methocarbamoL 750 mg Tablet  Commonly known as: ROBAXIN   750 mg, Oral, EVERY 8 HOURS, prn  Refills: 0     Movantik 25 mg Tablet  Generic drug: naloxegoL   25 mg, Oral, EVERY MORNING  Refills: 0     rOPINIRole 1 mg Tablet  Commonly known as: REQUIP   1 mg, Oral, NIGHTLY  Refills: 0     verapamiL 80 mg Tablet  Commonly known as: CALAN   80 mg, Oral, 2 TIMES DAILY, 2 tab in am and 1 tab evening  Refills: 0

## 2022-09-13 NOTE — Discharge Instructions (Addendum)
Returned for vaccination on day 0, 3, 7, 14 .  Your next vaccinations in 4 days

## 2022-09-13 NOTE — ED Nurses Note (Signed)

## 2022-09-17 ENCOUNTER — Emergency Department
Admission: EM | Admit: 2022-09-17 | Discharge: 2022-09-17 | Disposition: A | Discharge status: Home / Self Care | Payer: Medicare HMO | Attending: Emergency Medicine | Admitting: Emergency Medicine

## 2022-09-17 ENCOUNTER — Other Ambulatory Visit: Payer: Self-pay

## 2022-09-17 ENCOUNTER — Encounter (HOSPITAL_BASED_OUTPATIENT_CLINIC_OR_DEPARTMENT_OTHER): Payer: Self-pay

## 2022-09-17 DIAGNOSIS — S61215D Laceration without foreign body of left ring finger without damage to nail, subsequent encounter: Secondary | ICD-10-CM | POA: Insufficient documentation

## 2022-09-17 DIAGNOSIS — Z23 Encounter for immunization: Secondary | ICD-10-CM | POA: Insufficient documentation

## 2022-09-17 DIAGNOSIS — W5511XA Bitten by horse, initial encounter: Secondary | ICD-10-CM | POA: Insufficient documentation

## 2022-09-17 DIAGNOSIS — Z203 Contact with and (suspected) exposure to rabies: Secondary | ICD-10-CM | POA: Insufficient documentation

## 2022-09-17 MED ORDER — RABIES VACCINE,HUMAN DIPLOID (PF) 2.5 UNIT INTRAMUSCULAR SOLUTION
INTRAMUSCULAR | Status: AC
Start: 2022-09-17 — End: 2022-09-17
  Filled 2022-09-17: qty 1

## 2022-09-17 MED ORDER — RABIES VACCINE,HUMAN DIPLOID (PF) 2.5 UNIT INTRAMUSCULAR SOLUTION
2.5000 [IU] | Freq: Once | INTRAMUSCULAR | Status: AC
Start: 2022-09-17 — End: 2022-09-17
  Administered 2022-09-17: 2.5 [IU] via INTRAMUSCULAR

## 2022-09-17 NOTE — ED Provider Notes (Signed)
[place] [facility] emergency department         HISTORY OF PRESENT ILLNESS     Date:  09/17/2022  Patient's Name:  Alec Friedman  Date of Birth:  [birth year]    Patient is a 52 year old male who was bitten by a horse on his left index finger.  Patient is in no distress no nausea or vomiting patient has had no untoward effects related to the rabies vaccine.  Patient has received 2 vaccines previously is here tonight for the 3rd.  He is in no distress        Review of Systems     Review of Systems   All other systems reviewed and are negative.      Previous History     Past Medical History:  Past Medical History:   Diagnosis Date    Back pain     Muscle weakness     Vitamin B 12 deficiency        Past Surgical History:  Past Surgical History:   Procedure Laterality Date    Hand reconstruction Right     Hx appendectomy      Hx back surgery      Hx cholecystectomy      Knee surgery      Neck surgery      Shoulder surgery         Social History:  Social History     Tobacco Use    Smoking status: Never    Smokeless tobacco: Current     Types: Chew    Tobacco comments:     LAST USED 04/27/22   Vaping Use    Vaping status: Never Used   Substance Use Topics    Alcohol use: Never    Drug use: Never     Social History     Substance and Sexual Activity   Drug Use Never       Family History:  No family history on file.    Medication History:  Current Outpatient Medications   Medication Sig    ALPRAZolam (XANAX) 0.5 mg Oral Tablet Take 1 Tablet (0.5 mg total) by mouth Three times a day    BELSOMRA 10 mg Oral Tablet Take 1 Tablet (10 mg total) by mouth Every night as needed    buPROPion (WELLBUTRIN SR) 150 mg Oral tablet sustained-release 12 hr Take 1 Tablet (150 mg total) by mouth Twice daily    cyanocobalamin (VITAMIN B12) 1,000 mcg/mL Injection Solution Inject 1 mL (1,000 mcg total) under the skin Every 14 days    gabapentin (NEURONTIN) 800 mg Oral Tablet Take 1 Tablet (800 mg total) by mouth Three  times a day    HYDROcodone-acetaminophen (NORCO) 10-325 mg Oral Tablet Take 1 Tablet by mouth Every 6 hours as needed for Pain    lactulose (ENULOSE) 10 gram/15 mL Oral Solution Take 30 mL by mouth Twice per day as needed    meloxicam (MOBIC) 15 mg Oral Tablet Take 1 Tablet (15 mg total) by mouth Once a day    methocarbamoL (ROBAXIN) 750 mg Oral Tablet Take 1 Tablet (750 mg total) by mouth Every 8 hours prn    MOVANTIK 25 mg Oral Tablet Take 1 Tablet (25 mg total) by mouth Every morning    rOPINIRole (REQUIP) 1 mg Oral Tablet Take 1 Tablet (1 mg total) by mouth Every night    verapamiL (CALAN) 80 mg Oral Tablet Take 1 Tablet (80 mg  total) by mouth Twice daily 2 tab in am and 1 tab evening       Allergies:  No Known Allergies    Physical Exam     Vitals:    BP 126/81   Pulse 65   Temp 36.4 C (97.6 F)   Resp 17   Ht 1.753 m (5\' 9" )   Wt 90.7 kg (200 lb)   SpO2 98%   BMI 29.53 kg/m           Physical Exam  Vitals and nursing note reviewed.   Constitutional:       General: He is not in acute distress.     Appearance: He is well-developed.   HENT:      Head: Normocephalic and atraumatic.   Eyes:      Conjunctiva/sclera: Conjunctivae normal.   Cardiovascular:      Rate and Rhythm: Normal rate and regular rhythm.      Heart sounds: No murmur heard.  Pulmonary:      Effort: Pulmonary effort is normal. No respiratory distress.      Breath sounds: Normal breath sounds.   Abdominal:      General: Bowel sounds are normal.      Palpations: Abdomen is soft.      Tenderness: There is no abdominal tenderness.   Musculoskeletal:         General: No swelling.      Cervical back: Neck supple.   Skin:     General: Skin is warm and dry.      Capillary Refill: Capillary refill takes less than 2 seconds.   Neurological:      General: No focal deficit present.      Mental Status: He is alert and oriented to person, place, and time.   Psychiatric:         Mood and Affect: Mood normal.         Diagnostic Studies/Treatment      Medications:  Medications Administered in the ED   rabies virus vaccine (IMOVAX) IM injection (2.5 Units IntraMUSCULAR Given 09/17/22 0305)       New Prescriptions    No medications on file       Labs:    No results found for this or any previous visit (from the past 12 hour(s)).     Radiology:  None    No orders to display       ECG:  NONE            Differential diagnosis  Rabies prophylaxis rabies vaccine    Course/Disposition/Plan     Course:      Patient here for 3rd rabies vaccine dose.  Disposition:    Discharged    Condition at Disposition:     Stable  Follow up:   Norval Gable, NP  [address]  [address]  [place] [address]  [phone]    Schedule an appointment as soon as possible for a visit   If symptoms worsen      Clinical Impression:     Clinical Impression   Rabies, need for prophylactic vaccination against (Primary)         Tivis Ringer, MD

## 2022-09-17 NOTE — ED Nurses Note (Signed)
Patient discharged home with family.  AVS reviewed with patient/care giver.  A written copy of the AVS and discharge instructions was given to the patient/care giver. Questions sufficiently answered as needed.  Patient/care giver encouraged to follow up with PCP as indicated.  In the event of an emergency, patient/care giver instructed to call 911 or go to the nearest emergency room. Pt verbalized understanding of discharge instructions. Denies any questions or concerns at this time. NO s/s of acute distress noted. Respirations even and unlabored.

## 2022-09-17 NOTE — ED Triage Notes (Signed)
Pt reports needing 3rd rabies injection, bitten by horse on left hand

## 2022-09-24 ENCOUNTER — Emergency Department
Admission: EM | Admit: 2022-09-24 | Discharge: 2022-09-24 | Disposition: A | Discharge status: Home / Self Care | Payer: Medicare HMO | Attending: Emergency Medicine | Admitting: Emergency Medicine

## 2022-09-24 ENCOUNTER — Other Ambulatory Visit: Payer: Self-pay

## 2022-09-24 ENCOUNTER — Encounter (HOSPITAL_BASED_OUTPATIENT_CLINIC_OR_DEPARTMENT_OTHER): Payer: Self-pay

## 2022-09-24 DIAGNOSIS — Z203 Contact with and (suspected) exposure to rabies: Secondary | ICD-10-CM | POA: Insufficient documentation

## 2022-09-24 DIAGNOSIS — S61452A Open bite of left hand, initial encounter: Secondary | ICD-10-CM

## 2022-09-24 DIAGNOSIS — Z23 Encounter for immunization: Secondary | ICD-10-CM | POA: Insufficient documentation

## 2022-09-24 DIAGNOSIS — S61452D Open bite of left hand, subsequent encounter: Secondary | ICD-10-CM | POA: Insufficient documentation

## 2022-09-24 DIAGNOSIS — W5511XA Bitten by horse, initial encounter: Secondary | ICD-10-CM

## 2022-09-24 DIAGNOSIS — Z5189 Encounter for other specified aftercare: Secondary | ICD-10-CM

## 2022-09-24 DIAGNOSIS — W5511XD Bitten by horse, subsequent encounter: Secondary | ICD-10-CM | POA: Insufficient documentation

## 2022-09-24 MED ORDER — RABIES VACCINE,HUMAN DIPLOID (PF) 2.5 UNIT INTRAMUSCULAR SOLUTION
2.5000 [IU] | Freq: Once | INTRAMUSCULAR | Status: AC
Start: 2022-09-24 — End: 2022-09-24
  Administered 2022-09-24: 2.5 [IU] via INTRAMUSCULAR

## 2022-09-24 MED ORDER — RABIES VACCINE,HUMAN DIPLOID (PF) 2.5 UNIT INTRAMUSCULAR SOLUTION
INTRAMUSCULAR | Status: AC
Start: 2022-09-24 — End: 2022-09-24
  Filled 2022-09-24: qty 1

## 2022-09-24 NOTE — ED Triage Notes (Signed)
Pt states that he's here for the final shot in his rabies vaccine series.

## 2022-09-24 NOTE — ED Nurses Note (Signed)
Patient returns for final rabies vaccine.  Wound to hand is healed.

## 2022-09-24 NOTE — ED Provider Notes (Signed)
CHIEF COMPLAINT  Chief Complaint   Patient presents with    Rabies Vaccine     Final shot     HISTORY OF PRESENT ILLNESS  Alec Friedman, date of birth [birth year], is a 52 y.o. male who presented to the Emergency Department here for a rabies vaccine shot.  He was initially seen here on the 28th of August.  At that time has been 8 days since he was bit on the left-hand by a horse.  The horse has been narrowing advised him to come and get a rabies shot.  So he was 1st shot here on the 28th he was shot on the 31st it on the 4th of September he got his 3rd vaccine tonight he was here for his 4th vaccine.  He was no complaints.          PAST MEDICAL/SURGICAL/FAMILY/SOCIAL HISTORY  Past Medical History:   Diagnosis Date    Back pain     Muscle weakness     Vitamin B 12 deficiency        Past Surgical History:   Procedure Laterality Date    HAND RECONSTRUCTION Right     middle, ring, and little finger amputation, fingers reconstructed back on hand    HX APPENDECTOMY      HX BACK SURGERY      HX CHOLECYSTECTOMY      KNEE SURGERY      NECK SURGERY      SHOULDER SURGERY         Family Medical History:    None       Social History     Socioeconomic History    Marital status: Divorced   Tobacco Use    Smoking status: Never    Smokeless tobacco: Current     Types: Chew    Tobacco comments:     LAST USED 04/27/22   Vaping Use    Vaping status: Never Used   Substance and Sexual Activity    Alcohol use: Never    Drug use: Never      ALLERGIES  No Known Allergies    PHYSICAL EXAM  VITAL SIGNS:  Filed Vitals:    09/24/22 0319   BP: 129/79   Pulse: 74   Resp: 17   Temp: 36.7 C (98 F)   SpO2: 97%     GENERAL: PATIENT IS ALERT AND ORIENTED TO PERSON, PLACE, AND TIME.  IN NO DISTRESS    EXTREMITIES: NO CYANOSIS, CLUBBING, OR EDEMA.  NO GROSS DEFORMITIES, MOVES ALL 4 EXTREMITIES.  Left hand if no sign of infection well-healing  SKIN: WARM AND DRY.  NEUROLOGIC: CRANIAL NERVES II THROUGH XII ARE GROSSLY INTACT ALTHOUGH NOT  INDIVIDUALLY TESTED.  No gross motor deficits  PSYCHIATRIC: JUDGMENT AND INSIGHT ARE SEEMINGLY INTACT. MOOD AND AFFECT ARE APPROPRIATE FOR THE SITUATION.    PROCEDURES    DIAGNOSTICS  Labs:  Labs listed below were reviewed and interpreted by me.  No results found for any visits on 09/24/22.  Radiology:       ED COURSE/MEDICAL DECISION MAKING  Medications Administered in the ED   rabies virus vaccine (IMOVAX) IM injection (has no administration in time range)          Medical Decision Making  52 year old male here for his 4th rabies vaccine.  Doing well otherwise.  Left hand which is where he got that is healing well      CRITICAL CARE    CLINICAL IMPRESSION  Clinical Impression  Visit for wound check (Primary)     DISPOSITION  Discharged       DISCHARGE MEDICATIONS  Current Discharge Medication List          //Doug Kinnie Scales M.D.   09/24/2022, 03:19   [facility]  Department of Emergency Medicine  [facility]    This note was partially generated using MModal Fluency Direct system, and there may be some incorrect words, spellings, and punctuation that were not noted in checking the note before saving.    -----

## 2022-10-14 ENCOUNTER — Other Ambulatory Visit: Payer: Self-pay

## 2022-10-14 ENCOUNTER — Encounter (HOSPITAL_BASED_OUTPATIENT_CLINIC_OR_DEPARTMENT_OTHER): Payer: Self-pay

## 2022-10-14 ENCOUNTER — Inpatient Hospital Stay (HOSPITAL_BASED_OUTPATIENT_CLINIC_OR_DEPARTMENT_OTHER): Payer: Medicare HMO

## 2022-10-14 ENCOUNTER — Emergency Department
Admission: EM | Admit: 2022-10-14 | Discharge: 2022-10-14 | Disposition: A | Discharge status: Home / Self Care | Payer: Medicare HMO | Attending: Emergency Medicine | Admitting: Emergency Medicine

## 2022-10-14 DIAGNOSIS — M1711 Unilateral primary osteoarthritis, right knee: Secondary | ICD-10-CM | POA: Insufficient documentation

## 2022-10-14 DIAGNOSIS — M7989 Other specified soft tissue disorders: Secondary | ICD-10-CM | POA: Insufficient documentation

## 2022-10-14 DIAGNOSIS — M25561 Pain in right knee: Secondary | ICD-10-CM | POA: Insufficient documentation

## 2022-10-14 MED ORDER — KETOROLAC 60 MG/2 ML INTRAMUSCULAR SOLUTION
60.0000 mg | INTRAMUSCULAR | Status: AC
Start: 2022-10-14 — End: 2022-10-14
  Administered 2022-10-14: 60 mg via INTRAMUSCULAR

## 2022-10-14 MED ORDER — DICLOFENAC SODIUM 75 MG TABLET,DELAYED RELEASE
75.0000 mg | DELAYED_RELEASE_TABLET | Freq: Two times a day (BID) | ORAL | 0 refills | Status: AC
Start: 2022-10-14 — End: ?

## 2022-10-14 MED ORDER — METHYLPREDNISOLONE SOD SUCC 125 MG SOLUTION FOR INJECTION WRAPPER
INTRAVENOUS | Status: AC
Start: 2022-10-14 — End: 2022-10-14
  Filled 2022-10-14: qty 2

## 2022-10-14 MED ORDER — KETOROLAC 60 MG/2 ML INTRAMUSCULAR SOLUTION
INTRAMUSCULAR | Status: AC
Start: 2022-10-14 — End: 2022-10-14
  Filled 2022-10-14: qty 2

## 2022-10-14 MED ORDER — METHYLPREDNISOLONE 4 MG TABLETS IN A DOSE PACK
ORAL_TABLET | ORAL | 0 refills | Status: DC
Start: 2022-10-14 — End: 2023-03-02

## 2022-10-14 MED ORDER — METHYLPREDNISOLONE SOD SUCC 125 MG SOLUTION FOR INJECTION WRAPPER
125.0000 mg | INTRAVENOUS | Status: AC
Start: 2022-10-14 — End: 2022-10-14
  Administered 2022-10-14: 125 mg via INTRAMUSCULAR

## 2022-10-14 NOTE — ED Provider Notes (Signed)
[facility]  Emergency Department  Attending Provider Note      CHIEF COMPLAINT  Chief Complaint   Patient presents with    Knee Pain     HISTORY OF PRESENT ILLNESS  Alec Friedman, date of birth 09-20-1970, is a 52 y.o. male who presented to the Emergency Department.    THE PATIENT PRESENTS EMERGENCY DEPARTMENT TODAY WITH A STATED COMPLAINT OF RIGHT KNEE PAIN PRESENT FOR LAST SEVERAL WEEKS.  HE STATES ABOUT A MONTH AGO HE WAS WRESTLING WITH A WORSE.  HE TWISTED HIS KNEE AT THAT TIME.  SYMPTOMS ARE SLIGHTLY WORSE WITH ACTIVITY MINIMALLY BETTER WITH REST AND MILD TO TIMES MODERATE WITH SOME ASSOCIATED JOINT SWELLING.  NO FEVERS CHILLS CHEST PAIN PALPITATION SHORTNESS OF BREATH COUGH DYSURIA HEMATURIA NAUSEA VOMITING.  COMPREHENSIVE 10+ REVIEW OF SYSTEMS OTHERWISE NEGATIVE.  NO OTHER ACUTE COMPLAINTS REPORTED BY THE PATIENT.    PAST MEDICAL/SURGICAL/FAMILY/SOCIAL HISTORY  Past Medical History:   Diagnosis Date    Back pain     Muscle weakness     Vitamin B 12 deficiency        Past Surgical History:   Procedure Laterality Date    HAND RECONSTRUCTION Right     middle, ring, and little finger amputation, fingers reconstructed back on hand    HX APPENDECTOMY      HX BACK SURGERY      HX CHOLECYSTECTOMY      KNEE SURGERY      NECK SURGERY      SHOULDER SURGERY         Family Medical History:    None       Social History     Socioeconomic History    Marital status: Divorced   Tobacco Use    Smoking status: Never    Smokeless tobacco: Current     Types: Chew    Tobacco comments:     LAST USED 04/27/22   Vaping Use    Vaping status: Never Used   Substance and Sexual Activity    Alcohol use: Never    Drug use: Never      ALLERGIES  No Known Allergies    PHYSICAL EXAM  VITAL SIGNS:  Filed Vitals:    10/14/22 0255   BP: 125/74   Pulse: 78   Resp: 18   Temp: 36.3 C (97.3 F)   SpO2: 98%     GENERAL: PATIENT IS ALERT AND ORIENTED TO PERSON, PLACE, AND TIME.  HEAD: NORMOCEPHALIC AND ATRAUMATIC.  EYES: PUPILS  EQUALLY ROUND AND REACT TO LIGHT. EXTRAOCULAR MOVEMENTS INTACT.  EARS: GROSS HEARING INTACT. EXTERNAL EARS WITHIN NORMAL LIMITS.  NOSE: NO SEPTAL DEVIATION. NASAL PASSAGES CLEAR.  THROAT: MOIST ORAL MUCOSA.   NECK: SUPPLE. TRACHEA MIDLINE.  HEART: REGULAR, RATE, AND RHYTHM.  LUNGS: CLEAR TO AUSCULTATION BILATERAL.  ABDOMEN: SOFT, NON-TENDER, NON-DISTENDED, AND BOWEL SOUNDS ARE PRESENT.  GENITOURINARY: DEFERRED.  RECTAL: DEFERRED.  EXTREMITIES: NO CYANOSIS, CLUBBING, OR EDEMA.  SKIN: WARM AND DRY.  MUSCULOSKELETAL:  MINIMAL JOINT EFFUSION RIGHT KNEE WITH SOME TENDERNESS TO PALPATION MORE SOME MEDIAL INFERIOR ASPECT.  NEUROLOGIC: CRANIAL NERVES II THROUGH XII ARE GROSSLY INTACT. SENSATION TO LIGHT TOUCH IS INTACT.  PSYCHIATRIC: JUDGMENT AND INSIGHT ARE SEEMINGLY INTACT. MOOD AND AFFECT ARE APPROPRIATE FOR THE SITUATION.    DIAGNOSTICS  Labs:  Labs listed below were reviewed and interpreted by me.  No results found for any visits on 10/14/22.  Radiology:       ED COURSE/MEDICAL DECISION MAKING  Medications Administered in the ED   ketorolac (TORADOL) 60mg /2 mL IM injection (has no administration in time range)   methylPREDNISolone sod succ (SOLU-medrol) 125 mg/2 mL injection (has no administration in time range)      ED Course as of 10/14/22 0337   Tue Oct 14, 2022   0337 XR KNEE RIGHT 4 OR MORE VIEWS  TODAY I, Kayleana Waites A. Myldred Raju, D.O., PERSONALLY VISUALIZED THE PATIENT'S DIAGNOSTIC IMAGING STUDIES.  DEFER TO THE RADIOLOGIST'S REPORT FOR THE FINAL DEFINITIVE RADIOLOGY READING.  MY INITIAL INDEPENDENT INTERPRETATION IS AS FOLLOWS, BUT NOT LIMITED TO:  DEGENERATIVE CHANGES        Medical Decision Making  Problems Addressed:  Right knee pain: acute illness or injury    Amount and/or Complexity of Data Reviewed  Radiology: ordered and independent interpretation performed. Decision-making details documented in ED Course.    Risk  Prescription drug management.  Diagnosis or treatment significantly limited by social  determinants of health.      CLINICAL IMPRESSION  Clinical Impression   Right knee pain - WITH MILD EFFUSION (Primary)     DISPOSITION  Discharged       DISCHARGE MEDICATIONS  Current Discharge Medication List        START taking these medications    Details   diclofenac sodium (VOLTAREN) 75 mg Oral Tablet, Delayed Release (E.C.) Take 1 Tablet (75 mg total) by mouth Twice daily  Qty: 30 Tablet, Refills: 0      Methylprednisolone (MEDROL DOSEPACK) 4 mg Oral Tablets, Dose Pack Take as instructed.  Qty: 21 Tablet, Refills: 0             /Meggie Laseter A. Earlene Plater, DO, MBA   10/14/2022, 03:36   [facility]  Department of Emergency Medicine  [facility]    This note was partially generated using MModal Fluency Direct system, and there may be some incorrect words, spellings, and punctuation that were not noted in checking the note before saving.

## 2022-10-14 NOTE — ED Nurses Note (Signed)
Ace wrap applied to R knee

## 2022-10-14 NOTE — ED Nurses Note (Signed)
Pt c/o R knee pain x 1 mo. Pt states has hx of knee surgery on R knee but re injured aprx 1 month ago. Pain on medial side of knee. No discoloration noted Swelling is present. Respirations even and unlabored w/ no s/s of acute distress noted at this time.Marland Kitchen

## 2022-10-14 NOTE — ED Triage Notes (Signed)
Pt states he has hx of rt knee surgery yrs ago and about a month ago, re-injured rt knee, and it has not improved any, worsening as time progressed with most of pain medial side of knee.

## 2022-10-14 NOTE — ED Nurses Note (Signed)
Patient discharged home with family.  AVS reviewed with patient/care giver.  A written copy of the AVS and discharge instructions was given to the patient/care giver. Scripts sent to pharmacy on file. Questions sufficiently answered as needed.  Patient/care giver encouraged to follow up with PCP as indicated.  In the event of an emergency, patient/care giver instructed to call 911 or go to the nearest emergency room. Pt verbalized understanding of discharge instructions. Denies any questions or concerns at this time. Respirations even and unlabored w/ no s/s of acute distress noted at this time.

## 2022-10-28 ENCOUNTER — Other Ambulatory Visit (HOSPITAL_COMMUNITY): Payer: Self-pay | Admitting: Family

## 2022-10-28 ENCOUNTER — Other Ambulatory Visit: Payer: Self-pay

## 2022-10-28 ENCOUNTER — Inpatient Hospital Stay
Admission: RE | Admit: 2022-10-28 | Discharge: 2022-10-28 | Disposition: A | Discharge status: Home / Self Care | Payer: Medicare HMO | Source: Ambulatory Visit | Attending: Family | Admitting: Family

## 2022-10-28 DIAGNOSIS — M79661 Pain in right lower leg: Secondary | ICD-10-CM

## 2022-10-28 DIAGNOSIS — M7989 Other specified soft tissue disorders: Secondary | ICD-10-CM | POA: Insufficient documentation

## 2023-01-16 ENCOUNTER — Other Ambulatory Visit: Payer: Self-pay

## 2023-01-16 ENCOUNTER — Other Ambulatory Visit (HOSPITAL_COMMUNITY): Payer: Self-pay | Admitting: Orthopaedic Surgery

## 2023-01-16 ENCOUNTER — Ambulatory Visit
Admission: RE | Admit: 2023-01-16 | Discharge: 2023-01-16 | Disposition: A | Discharge status: Home / Self Care | Payer: Medicare HMO | Source: Ambulatory Visit | Attending: Orthopaedic Surgery | Admitting: Orthopaedic Surgery

## 2023-01-16 ENCOUNTER — Ambulatory Visit (HOSPITAL_COMMUNITY): Payer: Medicare HMO

## 2023-01-16 DIAGNOSIS — Z01818 Encounter for other preprocedural examination: Secondary | ICD-10-CM

## 2023-01-16 LAB — BASIC METABOLIC PANEL
ANION GAP: 5 mmol/L (ref 4–13)
BUN/CREA RATIO: 12 (ref 6–22)
BUN: 12 mg/dL (ref 7–25)
CALCIUM: 9.1 mg/dL (ref 8.6–10.3)
CHLORIDE: 104 mmol/L (ref 98–107)
CO2 TOTAL: 33 mmol/L — ABNORMAL HIGH (ref 21–31)
CREATININE: 1.03 mg/dL (ref 0.60–1.30)
ESTIMATED GFR: 88 mL/min/{1.73_m2} (ref >59)
GLUCOSE: 95 mg/dL (ref 74–109)
OSMOLALITY, CALCULATED: 283 mosm/kg (ref 270–290)
POTASSIUM: 4.6 mmol/L (ref 3.5–5.1)
SODIUM: 142 mmol/L (ref 136–145)

## 2023-01-16 LAB — URINALYSIS, MICROSCOPIC
RBCS: 1 /[HPF] (ref <4)
WBCS: 1 /[HPF] (ref <6)

## 2023-01-16 LAB — CBC
HCT: 44.3 % (ref 36.7–47.1)
HGB: 14.9 g/dL (ref 12.5–16.3)
MCH: 31.3 pg (ref 23.8–33.4)
MCHC: 33.8 g/dL (ref 32.5–36.3)
MCV: 92.8 fL (ref 73.0–96.2)
MPV: 8.6 fL (ref 7.4–11.4)
PLATELETS: 300 10*3/uL (ref 140–440)
RBC: 4.77 10*6/uL (ref 4.06–5.63)
RDW: 13.3 % (ref 12.1–16.2)
WBC: 5.8 10*3/uL (ref 3.6–10.2)

## 2023-01-16 LAB — URINALYSIS, MACROSCOPIC
BILIRUBIN: NEGATIVE mg/dL
BLOOD: NEGATIVE mg/dL
GLUCOSE: NEGATIVE mg/dL
KETONES: NEGATIVE mg/dL
LEUKOCYTES: NEGATIVE WBCs/uL
NITRITE: NEGATIVE
PH: 6 (ref 5.0–9.0)
PROTEIN: 20 mg/dL
SPECIFIC GRAVITY: 1.034 — ABNORMAL HIGH (ref 1.002–1.030)
UROBILINOGEN: NORMAL mg/dL

## 2023-01-19 DIAGNOSIS — I252 Old myocardial infarction: Secondary | ICD-10-CM

## 2023-01-19 DIAGNOSIS — Z01810 Encounter for preprocedural cardiovascular examination: Secondary | ICD-10-CM

## 2023-01-19 LAB — ECG 12 LEAD
Atrial Rate: 63 {beats}/min
Calculated P Axis: 34 degrees
Calculated R Axis: 63 degrees
Calculated T Axis: -5 degrees
PR Interval: 166 ms
QRS Duration: 100 ms
QT Interval: 422 ms
QTC Calculation: 431 ms
Ventricular rate: 63 {beats}/min

## 2023-02-05 ENCOUNTER — Other Ambulatory Visit (HOSPITAL_COMMUNITY): Payer: Self-pay | Admitting: Internal Medicine

## 2023-02-05 DIAGNOSIS — R011 Cardiac murmur, unspecified: Secondary | ICD-10-CM

## 2023-02-05 DIAGNOSIS — R9431 Abnormal electrocardiogram [ECG] [EKG]: Secondary | ICD-10-CM

## 2023-02-05 DIAGNOSIS — Z01818 Encounter for other preprocedural examination: Secondary | ICD-10-CM

## 2023-02-05 DIAGNOSIS — R0609 Other forms of dyspnea: Secondary | ICD-10-CM

## 2023-02-11 ENCOUNTER — Ambulatory Visit (HOSPITAL_COMMUNITY)
Admission: RE | Admit: 2023-02-11 | Discharge: 2023-02-11 | Disposition: A | Discharge status: Home / Self Care | Payer: Medicare HMO | Source: Ambulatory Visit | Attending: Internal Medicine | Admitting: Internal Medicine

## 2023-02-11 ENCOUNTER — Other Ambulatory Visit: Payer: Self-pay

## 2023-02-11 ENCOUNTER — Ambulatory Visit
Admission: RE | Admit: 2023-02-11 | Discharge: 2023-02-11 | Disposition: A | Discharge status: Home / Self Care | Payer: Medicare HMO | Source: Ambulatory Visit | Attending: Internal Medicine | Admitting: Internal Medicine

## 2023-02-11 DIAGNOSIS — Z01818 Encounter for other preprocedural examination: Secondary | ICD-10-CM | POA: Insufficient documentation

## 2023-02-11 DIAGNOSIS — R9431 Abnormal electrocardiogram [ECG] [EKG]: Secondary | ICD-10-CM | POA: Insufficient documentation

## 2023-02-11 DIAGNOSIS — R0609 Other forms of dyspnea: Secondary | ICD-10-CM | POA: Insufficient documentation

## 2023-02-11 DIAGNOSIS — R011 Cardiac murmur, unspecified: Secondary | ICD-10-CM | POA: Insufficient documentation

## 2023-02-11 MED ORDER — REGADENOSON 0.4 MG/5 ML INTRAVENOUS SYRINGE
0.4000 mg | INJECTION | Freq: Once | INTRAVENOUS | Status: AC
  Administered 2023-02-11: 0.4 mg via INTRAVENOUS
  Filled 2023-02-11: qty 5

## 2023-02-12 ENCOUNTER — Ambulatory Visit (HOSPITAL_COMMUNITY): Payer: Medicare HMO

## 2023-02-12 ENCOUNTER — Ambulatory Visit
Admission: RE | Admit: 2023-02-12 | Discharge: 2023-02-12 | Disposition: A | Discharge status: Home / Self Care | Payer: Medicare HMO | Source: Ambulatory Visit | Attending: Internal Medicine

## 2023-02-12 ENCOUNTER — Ambulatory Visit (HOSPITAL_COMMUNITY)
Admission: RE | Admit: 2023-02-12 | Discharge: 2023-02-12 | Disposition: A | Discharge status: Home / Self Care | Payer: Medicare HMO | Source: Ambulatory Visit

## 2023-02-12 ENCOUNTER — Ambulatory Visit (HOSPITAL_COMMUNITY): Payer: Self-pay

## 2023-02-13 ENCOUNTER — Ambulatory Visit (HOSPITAL_COMMUNITY): Payer: Self-pay

## 2023-02-23 ENCOUNTER — Ambulatory Visit: Payer: Medicare HMO | Attending: Orthopaedic Surgery

## 2023-02-23 ENCOUNTER — Other Ambulatory Visit: Payer: Self-pay

## 2023-02-23 DIAGNOSIS — Z01818 Encounter for other preprocedural examination: Secondary | ICD-10-CM | POA: Insufficient documentation

## 2023-02-23 LAB — CBC
HCT: 49.6 % — ABNORMAL HIGH (ref 36.7–47.1)
HGB: 16.4 g/dL — ABNORMAL HIGH (ref 12.5–16.3)
MCH: 30.7 pg (ref 23.8–33.4)
MCHC: 33.1 g/dL (ref 32.5–36.3)
MCV: 92.9 fL (ref 73.0–96.2)
MPV: 8.6 fL (ref 7.4–11.4)
PLATELETS: 334 10*3/uL (ref 140–440)
RBC: 5.34 10*6/uL (ref 4.06–5.63)
RDW: 13.2 % (ref 12.1–16.2)
WBC: 5 10*3/uL (ref 3.6–10.2)

## 2023-02-23 LAB — URINALYSIS, MACROSCOPIC
BILIRUBIN: NEGATIVE mg/dL
BLOOD: NEGATIVE mg/dL
GLUCOSE: NEGATIVE mg/dL
KETONES: NEGATIVE mg/dL
LEUKOCYTES: NEGATIVE WBCs/uL
NITRITE: NEGATIVE
PH: 5.5 (ref 5.0–9.0)
PROTEIN: 20 mg/dL
SPECIFIC GRAVITY: 1.028 (ref 1.002–1.030)
UROBILINOGEN: 2 mg/dL — AB

## 2023-02-23 LAB — BASIC METABOLIC PANEL
ANION GAP: 5 mmol/L (ref 4–13)
BUN/CREA RATIO: 15 (ref 6–22)
BUN: 15 mg/dL (ref 7–25)
CALCIUM: 9.1 mg/dL (ref 8.6–10.3)
CHLORIDE: 104 mmol/L (ref 98–107)
CO2 TOTAL: 31 mmol/L (ref 21–31)
CREATININE: 1.01 mg/dL (ref 0.60–1.30)
ESTIMATED GFR: 90 mL/min/{1.73_m2} (ref >59)
GLUCOSE: 88 mg/dL (ref 74–109)
OSMOLALITY, CALCULATED: 280 mosm/kg (ref 270–290)
POTASSIUM: 4.3 mmol/L (ref 3.5–5.1)
SODIUM: 140 mmol/L (ref 136–145)

## 2023-02-23 LAB — URINALYSIS, MICROSCOPIC
RBCS: 2 /[HPF] (ref <4)
WBCS: 1 /[HPF] (ref <6)

## 2023-02-24 ENCOUNTER — Other Ambulatory Visit (HOSPITAL_COMMUNITY): Payer: Self-pay | Admitting: Internal Medicine

## 2023-02-24 ENCOUNTER — Encounter (HOSPITAL_COMMUNITY): Payer: Self-pay | Admitting: Internal Medicine

## 2023-02-24 DIAGNOSIS — Z01818 Encounter for other preprocedural examination: Secondary | ICD-10-CM

## 2023-02-24 DIAGNOSIS — R0609 Other forms of dyspnea: Secondary | ICD-10-CM

## 2023-02-24 DIAGNOSIS — R9431 Abnormal electrocardiogram [ECG] [EKG]: Secondary | ICD-10-CM

## 2023-02-24 DIAGNOSIS — R011 Cardiac murmur, unspecified: Secondary | ICD-10-CM

## 2023-02-26 ENCOUNTER — Encounter (HOSPITAL_COMMUNITY): Payer: Self-pay | Admitting: Orthopaedic Surgery

## 2023-03-03 ENCOUNTER — Ambulatory Visit (HOSPITAL_COMMUNITY): Payer: Medicare HMO

## 2023-03-03 ENCOUNTER — Ambulatory Visit
Admission: RE | Admit: 2023-03-03 | Discharge: 2023-03-03 | Disposition: A | Discharge status: Home / Self Care | Payer: Medicare HMO | Source: Ambulatory Visit | Attending: Orthopaedic Surgery | Admitting: Orthopaedic Surgery

## 2023-03-03 ENCOUNTER — Encounter (HOSPITAL_COMMUNITY)
Admission: RE | Disposition: A | Discharge status: Home / Self Care | Payer: Self-pay | Source: Ambulatory Visit | Attending: Orthopaedic Surgery

## 2023-03-03 ENCOUNTER — Other Ambulatory Visit: Payer: Self-pay

## 2023-03-03 ENCOUNTER — Ambulatory Visit (HOSPITAL_COMMUNITY): Payer: Medicare HMO | Admitting: Orthopaedic Surgery

## 2023-03-03 ENCOUNTER — Encounter (HOSPITAL_COMMUNITY): Payer: Self-pay | Admitting: Orthopaedic Surgery

## 2023-03-03 DIAGNOSIS — M1711 Unilateral primary osteoarthritis, right knee: Secondary | ICD-10-CM | POA: Insufficient documentation

## 2023-03-03 DIAGNOSIS — I252 Old myocardial infarction: Secondary | ICD-10-CM | POA: Insufficient documentation

## 2023-03-03 DIAGNOSIS — Z87442 Personal history of urinary calculi: Secondary | ICD-10-CM | POA: Insufficient documentation

## 2023-03-03 DIAGNOSIS — M539 Dorsopathy, unspecified: Secondary | ICD-10-CM | POA: Insufficient documentation

## 2023-03-03 DIAGNOSIS — S83241A Other tear of medial meniscus, current injury, right knee, initial encounter: Secondary | ICD-10-CM | POA: Insufficient documentation

## 2023-03-03 HISTORY — DX: Other general symptoms and signs: R68.89

## 2023-03-03 HISTORY — DX: Calculus of kidney: N20.0

## 2023-03-03 HISTORY — DX: Other injury of unspecified body region, initial encounter: T14.8XXA

## 2023-03-03 HISTORY — DX: Other chronic pain: G89.29

## 2023-03-03 HISTORY — DX: Acute myocardial infarction, unspecified: I21.9

## 2023-03-03 SURGERY — ARTHROSCOPY KNEE WITH MENISCAL REPAIR
Anesthesia: General | Site: Knee | Laterality: Right | Wound class: Clean Wound: Uninfected operative wounds in which no inflammation occurred

## 2023-03-03 MED ORDER — HYDROMORPHONE (PF) 2 MG/ML INJECTION SOLUTION
1.0000 mg | Freq: Once | INTRAMUSCULAR | Status: DC | PRN
Start: 2023-03-03 — End: 2023-03-03

## 2023-03-03 MED ORDER — SODIUM CHLORIDE 0.9 % (FLUSH) INJECTION SYRINGE
3.0000 mL | INJECTION | Freq: Three times a day (TID) | INTRAMUSCULAR | Status: DC
Start: 2023-03-03 — End: 2023-03-03

## 2023-03-03 MED ORDER — LACTATED RINGERS INTRAVENOUS SOLUTION
INTRAVENOUS | Status: DC
Start: 2023-03-03 — End: 2023-03-03

## 2023-03-03 MED ORDER — HYDROCODONE 7.5 MG-ACETAMINOPHEN 325 MG TABLET: 1.0000 | ORAL_TABLET | ORAL | 0 refills | Status: DC | PRN

## 2023-03-03 MED ORDER — EPHEDRINE SULFATE 50 MG/ML INTRAVENOUS SOLUTION
Freq: Once | INTRAVENOUS | Status: DC | PRN
Start: 2023-03-03 — End: 2023-03-03
  Administered 2023-03-03: 10 mg via INTRAVENOUS

## 2023-03-03 MED ORDER — FENTANYL (PF) 50 MCG/ML INJECTION WRAPPER
50.0000 ug | INJECTION | INTRAMUSCULAR | Status: DC | PRN
Start: 2023-03-03 — End: 2023-03-03

## 2023-03-03 MED ORDER — ONDANSETRON HCL (PF) 4 MG/2 ML INJECTION SOLUTION
4.0000 mg | Freq: Once | INTRAMUSCULAR | Status: DC | PRN
Start: 2023-03-03 — End: 2023-03-03

## 2023-03-03 MED ORDER — FENTANYL (PF) 50 MCG/ML INJECTION WRAPPER
INJECTION | Freq: Once | INTRAMUSCULAR | Status: DC | PRN
Start: 2023-03-03 — End: 2023-03-03
  Administered 2023-03-03: 25 ug via INTRAVENOUS
  Administered 2023-03-03: 50 ug via INTRAVENOUS
  Administered 2023-03-03: 25 ug via INTRAVENOUS

## 2023-03-03 MED ORDER — ROPIVACAINE (PF) 2 MG/ML (0.2 %) INJECTION SOLUTION
INTRAMUSCULAR | Status: AC
Start: 2023-03-03 — End: 2023-03-03
  Filled 2023-03-03: qty 10

## 2023-03-03 MED ORDER — LIDOCAINE (PF) 100 MG/5 ML (2 %) INTRAVENOUS SYRINGE
INJECTION | Freq: Once | INTRAVENOUS | Status: DC | PRN
Start: 2023-03-03 — End: 2023-03-03
  Administered 2023-03-03: 100 mg via INTRAVENOUS

## 2023-03-03 MED ORDER — FENTANYL (PF) 50 MCG/ML INJECTION SOLUTION
INTRAMUSCULAR | Status: AC
Start: 2023-03-03 — End: 2023-03-03
  Filled 2023-03-03: qty 2

## 2023-03-03 MED ORDER — OXYCODONE-ACETAMINOPHEN 5 MG-325 MG TABLET
1.0000 | ORAL_TABLET | Freq: Once | ORAL | Status: DC | PRN
Start: 2023-03-03 — End: 2023-03-03
  Administered 2023-03-03: 1 via ORAL
  Filled 2023-03-03: qty 1

## 2023-03-03 MED ORDER — ALBUTEROL SULFATE 2.5 MG/3 ML (0.083 %) SOLUTION FOR NEBULIZATION
2.5000 mg | INHALATION_SOLUTION | Freq: Once | RESPIRATORY_TRACT | Status: DC | PRN
Start: 2023-03-03 — End: 2023-03-03

## 2023-03-03 MED ORDER — SODIUM CHLORIDE 0.9 % (FLUSH) INJECTION SYRINGE
3.0000 mL | INJECTION | INTRAMUSCULAR | Status: DC | PRN
Start: 2023-03-03 — End: 2023-03-03

## 2023-03-03 MED ORDER — MORPHINE 10 MG/ML INJECTION WRAPPER
INTRAVENOUS | Status: AC
Start: 2023-03-03 — End: 2023-03-03
  Filled 2023-03-03: qty 1

## 2023-03-03 MED ORDER — PHENYLEPHRINE 10 MG/ML INJECTION SOLUTION
Freq: Once | INTRAMUSCULAR | Status: DC | PRN
Start: 2023-03-03 — End: 2023-03-03
  Administered 2023-03-03: 100 ug via INTRAVENOUS

## 2023-03-03 MED ORDER — IPRATROPIUM 0.5 MG-ALBUTEROL 3 MG (2.5 MG BASE)/3 ML NEBULIZATION SOLN
3.0000 mL | INHALATION_SOLUTION | Freq: Once | RESPIRATORY_TRACT | Status: DC | PRN
Start: 2023-03-03 — End: 2023-03-03

## 2023-03-03 MED ORDER — FENTANYL (PF) 50 MCG/ML INJECTION WRAPPER
25.0000 ug | INJECTION | INTRAMUSCULAR | Status: DC | PRN
Start: 2023-03-03 — End: 2023-03-03

## 2023-03-03 MED ORDER — ROPIVACAINE (PF) 2 MG/ML (0.2 %) INJECTION SOLUTION
Freq: Once | INTRAVENOUS | Status: DC | PRN
Start: 2023-03-03 — End: 2023-03-03

## 2023-03-03 MED ORDER — PROPOFOL 10 MG/ML IV BOLUS
INJECTION | Freq: Once | INTRAVENOUS | Status: DC | PRN
Start: 2023-03-03 — End: 2023-03-03
  Administered 2023-03-03: 200 mg via INTRAVENOUS

## 2023-03-03 MED ORDER — PROCHLORPERAZINE EDISYLATE 10 MG/2 ML (5 MG/ML) INJECTION SOLUTION
5.0000 mg | Freq: Once | INTRAMUSCULAR | Status: DC | PRN
Start: 2023-03-03 — End: 2023-03-03

## 2023-03-03 SURGICAL SUPPLY — 30 items
BANDAGE ELT 5.8YDX4IN NONST SLFCLS ELAS KNIT TEAL END STCH VELCRO COTTON POLY BLND STD LGTH COMPRESS (WOUND CARE SUPPLY) ×1 IMPLANT
BANDAGE ESMARK 9FTX6IN STRL SYN COMPRESS LF (WOUND CARE SUPPLY) ×1 IMPLANT
BLADE 11 2 END CBNSTL SURG STRL DISP (SURGICAL CUTTING SUPPLIES) ×1 IMPLANT
BLADE SHAVER 13CM 4MM COOLCUT 2 CUT POWER SFT TISS RESCT STRL DISP (ENDOSCOPIC SUPPLIES) ×1 IMPLANT
CLEANER INSTRUMENT PRE-KLENZ 13.5 OZ (MISCELLANEOUS PT CARE ITEMS) ×1 IMPLANT
COUNTER 20 CNT BLOCK ADH NEEDLE STRL LF  RD SHARP FOAM 15.75X11.5X14IN DISP (MED SURG SUPPLIES) ×1 IMPLANT
COVER EQP 90X60IN HVDTY BCK PAD FNFLD BLU STRL (DRAPE/PACKS/SHEETS/OR TOWEL) ×1 IMPLANT
CUFF TOURNIQUET PURP 34X4IN COLOR CUF CYL 2 PORT 1 BLADDER QC 40IN STRL LF  DISP (MED SURG SUPPLIES) ×1 IMPLANT
DRAPE INCS ANTIMIC 23X23IN IOBN2 TRNSPR (DRAPE/PACKS/SHEETS/OR TOWEL) ×1 IMPLANT
GLOVE SURG 6.5 LF  PF BEAD CUF STRL CRM 11.3IN PROTEXIS PI PLISPRN THK9.1 MIL (GLOVES AND ACCESSORIES) ×1 IMPLANT
GLOVE SURG 6.5 LF  PF SMOOTH BEAD CUF INTLK STRL BLU 11.3IN PROTEXIS NEU-THERA PLISPRN THK7.9 MIL (GLOVES AND ACCESSORIES) ×1 IMPLANT
GLOVE SURG 7.5 LTX PF SMOOTH BEAD CUF STRL YW 12IN PROTEXIS (GLOVES AND ACCESSORIES) ×1 IMPLANT
GLOVE SURG 8 LTX PF SMOOTH BEAD CUF STRL YW 12IN PROTEXIS NEU-THERA DDRGL THK8.7 MIL (GLOVES AND ACCESSORIES) ×1 IMPLANT
GOWN SURG LRG STD LGTH L3 HKLP CLSR RGLN SLEEVE TWL STRL LF  DISP GRN AERO BLU PRFRM FBRC (DRAPE/PACKS/SHEETS/OR TOWEL) ×1 IMPLANT
GOWN SURGICAL SIRUS SMS LG XLONG 52IN LEV 4 STRL LF DISP BLUE (GLOVES AND ACCESSORIES) ×1 IMPLANT
LABEL MED CORRECT MED LABELING SYS 4 FLG 2 SHEET 24 PRPRNT STRL (MED SURG SUPPLIES) ×1 IMPLANT
MAT INSTR TRY 44X36IN WTPRF BACKSHEET TPNX BLU (MISCELLANEOUS PT CARE ITEMS) ×1 IMPLANT
NEEDLE HYPO  18GA 1.5IN REG WL BD PRCSNGL POLYPROP REG BVL LL HUB CLR CD DEHP-FR STRL LF  DISP (MED SURG SUPPLIES) ×1 IMPLANT
NEEDLE SPINAL PNK 3.5IN 18GA QUINCKE REG WL POLYPROP QUINCKE TIP STRL LF  DISP (MED SURG SUPPLIES) ×1 IMPLANT
PUMP TUBING 13FT CONT WV III DUALWAVE ARTHRO STRL DISP (MED SURG SUPPLIES) ×1 IMPLANT
SOL IRRG LR 3L PRSV N-PYRG FLXB CONTAINR STRL LF (MEDICATIONS/SOLUTIONS) ×1 IMPLANT
SOL SURG PREP 26ML DRPRP 74% ISPRP 0.7% IOD POVACRYLEX SLF CNTN APPL SKIN STRL PREOP (MED SURG SUPPLIES) ×1 IMPLANT
SPONGE GAUZE 4X4IN MDCHC COTTON 12 PLY TY 7 LF  STRL DISP (WOUND CARE SUPPLY) ×1 IMPLANT
SUTURE 4-0 FS1 PROLENE 18IN BLU MONOF NONAB (SUTURE/WOUND CLOSURE) ×1 IMPLANT
SYRINGE LL 10ML LF  STRL GRAD N-PYRG DEHP-FR PVC FREE MED DISP (MED SURG SUPPLIES) ×1 IMPLANT
TOWEL 24X16IN COTTON BLU DISP SURG STRL LF (DRAPE/PACKS/SHEETS/OR TOWEL) ×1 IMPLANT
TRAY ~~LOC~~ ARTHROSCOPY ~~LOC~~ - ~~LOC~~ COMMUNITY HOSP (CUSTOM TRAYS & PACK) ×1 IMPLANT
TUBING IRRG 6FT DUALWAVE BKFL CK VALVE EXT STRL DISP (ENDOSCOPIC SUPPLIES) ×1 IMPLANT
TUBING SUCT CLR 12FT .25IN ARGYLE PVC NCDTV STR MALE FEMALE MLD CONN STRL LF (MED SURG SUPPLIES) ×1 IMPLANT
TUBING SUCT CLR 6FT .25IN ARGYLE PVC NCDTV STR MALE FEMALE MLD CONN STRL LF (MED SURG SUPPLIES) ×1 IMPLANT

## 2023-03-03 NOTE — OR Surgeon (Signed)
[facility]   Operative Note   PATIENT NAME:  Alec Friedman, Alec Friedman  MRN:  [id number]  DOB:  [birth year]    Date of Procedure:  03/03/2023  Preoperative Diagnosis: MEDIAL MENISCUS TEAR RIGHT KNEE   Postoperative Diagnoses:  ARTHRITIS AND MEDIAL MENISCUS TEAR RIGHT KNEE   Procedure Performed: Procedure(s) (LRB):  DIAGNOSTIC ARTHROSCOPY WITH PARTIAL MEDIAL MENISCECTOMY AND DEBRIDEMENT RIGHT KNEE (Right)    Surgeon: Quenton Fetter, DO   Anesthesia: General    Estimated Blood Loss: Minimal  Complications: None immediate  Description of Procedure patient taken to the operating room given a general anesthetic tourniquet placed over padding on the right thigh leg placed in the knee holder prepped with DuraPrep draped in a sterile manner.  Esmarch exsanguination tourniquet to 350 mmHg medial and lateral portals utilized exam of the knee performed.  Patellofemoral joint with grade 3 changes gentle debridement performed back to stable edges.  Medial gutter was clear medial compartment with a posterior horn tear of the medial meniscus excised with basket forceps and shaver back to a stable edge peripheral probed and stable notch probed and palpated with intact ACL graft marginal osteophytes.  Lateral compartment probed and palpated without abnormality lateral gutter was clear.  Knee lavaged, all loose fragments removed, injected with Naropin.  Portals closed with 5 0 Prolene suture in an interrupted fashion sterile bandage applied tourniquet released and the patient was awoken from anesthesia and brought to recovery in stable condition.  Quenton Fetter, DO   This note was partially generated using MModal Fluency Direct system, and there may be some incorrect words, spellings, and punctuation that were not noted in checking the note before saving.

## 2023-03-03 NOTE — Discharge Instructions (Signed)
MAY REMOVE DRESSING IN 48-72HOURS. THEN CHANGE DAILY AND AS NEEDED.    KEEP INCISION SITE CLEAN AND DRY.    NO LIFTING, PULLING, TUGGING WITH SURGERY HAND.    CALL DR FOR ANY PROBLEMS.    Follow up with Lequita Halt on 03/17/23 @ 8:30

## 2023-03-03 NOTE — Interval H&P Note (Signed)
 H & P updated the day of the procedure.  1.  H&P completed within 30 days of surgical procedure and has been reviewed within 24 hours of admission but prior to surgery or a procedure requiring anesthesia services, the patient has been examined, and no change has occured in the patients condition since the H&P was completed.       Change in medications: No        No LMP recorded.      Comments:     2.  Patient continues to be appropriate candidate for planned surgical procedure. YES    Alec Fetter, DO

## 2023-03-03 NOTE — OR Nursing (Signed)
 PICTURES TAKEN? YES

## 2023-03-03 NOTE — Anesthesia Preprocedure Evaluation (Signed)
ANESTHESIA PRE-OP EVALUATION  Planned Procedure: DIAGNOSTIC ARTHROSCOPY WITH REPAIR VERSUS EXCISION MEDIAL MENISCUS RIGHT KNEE (Right: Knee)  Review of Systems     anesthesia history negative               Pulmonary     Cardiovascular    Past MI and Negative ETT 1/25 ,       GI/Hepatic/Renal    kidney stones        Endo/Other         Neuro/Psych/MS    Neck problems     Cancer                        Physical Assessment      Airway       Mallampati: II      Neck ROM: limited  Mouth Opening: good.  Facial hair  Beard        Dental                    Pulmonary    Breath sounds clear to auscultation       Cardiovascular    Rhythm: regular  Rate: Normal       Other findings              Plan  ASA 2     Planned anesthesia type: general     general anesthesia with laryngeal mask airway                    Intravenous induction       Anesthetic plan and risks discussed with patient  signed consent obtained          Patient's NPO status is appropriate for Anesthesia.           (GA / LMA.  Discussed with patient.  All questions answered )

## 2023-03-03 NOTE — Anesthesia Postprocedure Evaluation (Signed)
Anesthesia Post Op Evaluation    Patient: Alec Friedman  Procedure(s):  DIAGNOSTIC ARTHROSCOPY WITH PARTIAL MEDIAL MENISCECTOMY AND DEBRIDEMENT RIGHT KNEE    Last Vitals:Temperature: 36 C (96.8 F) (03/03/23 0839)  Heart Rate: 68 (03/03/23 0839)  BP (Non-Invasive): 139/87 (03/03/23 0839)  Respiratory Rate: 16 (03/03/23 0839)  SpO2: 93 % (03/03/23 0839)    No notable events documented.    Patient is sufficiently recovered from the effects of anesthesia to participate in the evaluation and has returned to their pre-procedure level.  Patient location during evaluation: PACU       Patient participation: complete - patient participated  Level of consciousness: awake and alert and responsive to verbal stimuli    Pain score: 2  Pain management: adequate  Airway patency: patent    Anesthetic complications: no  Cardiovascular status: acceptable  Respiratory status: acceptable  Hydration status: acceptable  Patient post-procedure temperature: Pt Normothermic   PONV Status: Absent

## 2023-03-03 NOTE — Anesthesia Transfer of Care (Signed)
ANESTHESIA TRANSFER OF CARE   Alec Friedman is a 53 y.o. ,male, Weight: 90.7 kg (200 lb)   had Procedure(s):  DIAGNOSTIC ARTHROSCOPY WITH PARTIAL MEDIAL MENISCECTOMY AND DEBRIDEMENT RIGHT KNEE  performed  03/03/23   Primary Service: Quenton Fetter, DO    Past Medical History:   Diagnosis Date   . Back pain    . Chronic pain    . Kidney stones    . MI (myocardial infarction) (CMS HCC)    . Muscle weakness    . Neck problem    . Nerve damage     neck, spine, legs   . Vitamin B 12 deficiency       Allergy History as of 03/03/23        No Known Allergies                  I completed my transfer of care / handoff to the receiving personnel during which we discussed:  Access, Airway, All key/critical aspects of case discussed, Analgesia, Fluids/Product, Gave opportunity for questions and acknowledgement of understanding, Labs, Antibiotics and Expectation of post procedure  Report given to: Omer Jack, RN    Post Location: PACU                                                           Last OR Temp: Temperature: 36 C (96.8 F)  ABG:  POTASSIUM   Date Value Ref Range Status   02/23/2023 4.3 3.5 - 5.1 mmol/L Final     KETONES   Date Value Ref Range Status   02/23/2023 Negative Negative, Trace mg/dL Final     CALCIUM   Date Value Ref Range Status   02/23/2023 9.1 8.6 - 10.3 mg/dL Final     Calculated P Axis   Date Value Ref Range Status   01/16/2023 34 degrees Final     Calculated R Axis   Date Value Ref Range Status   01/16/2023 63 degrees Final     Calculated T Axis   Date Value Ref Range Status   01/16/2023 -5 degrees Final     Airway:* No LDAs found *  Blood pressure 139/87, pulse 68, temperature 36 C (96.8 F), resp. rate 16, height 1.753 m (5\' 9" ), weight 90.7 kg (200 lb), SpO2 93%.

## 2023-03-03 NOTE — H&P (Signed)
 Paper H and P on chart, will be scanned to EMR.

## 2023-03-18 HISTORY — DX: Other injury of unspecified body region, initial encounter: T14.8XXA

## 2023-03-18 HISTORY — DX: Calculus of kidney: N20.0

## 2023-03-18 HISTORY — DX: Other general symptoms and signs: R68.89

## 2023-03-18 HISTORY — DX: Other chronic pain: G89.29

## 2023-03-18 HISTORY — DX: Acute myocardial infarction, unspecified (CMS HCC): I21.9

## 2023-04-10 ENCOUNTER — Emergency Department (HOSPITAL_COMMUNITY)

## 2023-04-10 ENCOUNTER — Other Ambulatory Visit: Payer: Self-pay

## 2023-04-10 ENCOUNTER — Emergency Department
Admission: EM | Admit: 2023-04-10 | Discharge: 2023-04-11 | Disposition: A | Discharge status: Home / Self Care | Source: Home / Self Care | Attending: Physician Assistant | Admitting: Physician Assistant

## 2023-04-10 DIAGNOSIS — Z1152 Encounter for screening for COVID-19: Secondary | ICD-10-CM | POA: Insufficient documentation

## 2023-04-10 DIAGNOSIS — J189 Pneumonia, unspecified organism: Secondary | ICD-10-CM | POA: Insufficient documentation

## 2023-04-10 DIAGNOSIS — R911 Solitary pulmonary nodule: Secondary | ICD-10-CM | POA: Insufficient documentation

## 2023-04-10 LAB — CBC WITH DIFF
BASOPHIL #: 0 10*3/uL (ref 0.00–0.10)
BASOPHIL %: 0 % (ref 0–1)
EOSINOPHIL #: 0 10*3/uL (ref 0.00–0.50)
EOSINOPHIL %: 0 % — ABNORMAL LOW (ref 1–8)
HCT: 45.6 % (ref 36.7–47.1)
HGB: 15.8 g/dL (ref 12.5–16.3)
LYMPHOCYTE #: 0.5 10*3/uL — ABNORMAL LOW (ref 1.00–3.20)
LYMPHOCYTE %: 5 % — ABNORMAL LOW (ref 15–43)
MCH: 31.7 pg (ref 23.8–33.4)
MCHC: 34.8 g/dL (ref 32.5–36.3)
MCV: 91.1 fL (ref 73.0–96.2)
MONOCYTE #: 0.6 10*3/uL (ref 0.30–1.10)
MONOCYTE %: 6 % (ref 6–14)
MPV: 8.7 fL (ref 7.4–11.4)
NEUTROPHIL #: 9.5 10*3/uL — ABNORMAL HIGH (ref 1.70–7.60)
NEUTROPHIL %: 89 % — ABNORMAL HIGH (ref 44–74)
PLATELETS: 330 10*3/uL (ref 140–440)
RBC: 5.01 10*6/uL (ref 4.06–5.63)
RDW: 13 % (ref 12.1–16.2)
WBC: 10.6 10*3/uL — ABNORMAL HIGH (ref 3.6–10.2)

## 2023-04-10 LAB — COMPREHENSIVE METABOLIC PANEL, NON-FASTING
ALBUMIN/GLOBULIN RATIO: 1.6 — ABNORMAL HIGH (ref 0.8–1.4)
ALBUMIN: 4.7 g/dL (ref 3.5–5.7)
ALKALINE PHOSPHATASE: 81 U/L (ref 34–104)
ALT (SGPT): 19 U/L (ref 7–52)
ANION GAP: 10 mmol/L (ref 4–13)
AST (SGOT): 19 U/L (ref 13–39)
BILIRUBIN TOTAL: 0.8 mg/dL (ref 0.3–1.0)
BUN/CREA RATIO: 10 (ref 6–22)
BUN: 15 mg/dL (ref 7–25)
CALCIUM, CORRECTED: 8.8 mg/dL — ABNORMAL LOW (ref 8.9–10.8)
CALCIUM: 9.4 mg/dL (ref 8.6–10.3)
CHLORIDE: 98 mmol/L (ref 98–107)
CO2 TOTAL: 27 mmol/L (ref 21–31)
CREATININE: 1.43 mg/dL — ABNORMAL HIGH (ref 0.60–1.30)
ESTIMATED GFR: 59 mL/min/{1.73_m2} — ABNORMAL LOW (ref >59)
GLOBULIN: 2.9 (ref 2.0–3.5)
GLUCOSE: 95 mg/dL (ref 74–109)
OSMOLALITY, CALCULATED: 271 mosm/kg (ref 270–290)
POTASSIUM: 4.5 mmol/L (ref 3.5–5.1)
PROTEIN TOTAL: 7.6 g/dL (ref 6.4–8.9)
SODIUM: 135 mmol/L — ABNORMAL LOW (ref 136–145)

## 2023-04-10 LAB — BLUE TOP TUBE

## 2023-04-10 LAB — COVID-19, FLU A/B, RSV RAPID BY PCR
INFLUENZA VIRUS TYPE A: NOT DETECTED
INFLUENZA VIRUS TYPE B: NOT DETECTED
RESPIRATORY SYNCTIAL VIRUS (RSV): NOT DETECTED
SARS-CoV-2: NOT DETECTED

## 2023-04-10 LAB — SEDIMENTATION RATE: ERYTHROCYTE SEDIMENTATION RATE (ESR): 15 mm/h (ref <20)

## 2023-04-10 LAB — URINALYSIS, MACROSCOPIC
BILIRUBIN: NEGATIVE mg/dL
BLOOD: NEGATIVE mg/dL
GLUCOSE: NEGATIVE mg/dL
KETONES: 20 mg/dL — AB
LEUKOCYTES: NEGATIVE WBCs/uL
NITRITE: NEGATIVE
PH: 6 (ref 5.0–9.0)
PROTEIN: 10 mg/dL
SPECIFIC GRAVITY: 1.022 (ref 1.002–1.030)
UROBILINOGEN: 2 mg/dL — AB

## 2023-04-10 LAB — GRAY TOP TUBE

## 2023-04-10 LAB — URINALYSIS, MICROSCOPIC
HYALINE CASTS: 1 /LPF — ABNORMAL HIGH (ref <0)
WBCS: 2 /HPF (ref <6)

## 2023-04-10 LAB — C-REACTIVE PROTEIN (CRP): C-REACTIVE PROTEIN (CRP): 6 mg/dL — ABNORMAL HIGH (ref 0.1–0.5)

## 2023-04-10 MED ORDER — ACETAMINOPHEN 325 MG TABLET
ORAL_TABLET | ORAL | Status: AC
Start: 2023-04-10 — End: 2023-04-10
  Filled 2023-04-10: qty 3

## 2023-04-10 MED ORDER — ACETAMINOPHEN 325 MG TABLET
975.0000 mg | ORAL_TABLET | ORAL | Status: AC
Start: 2023-04-10 — End: 2023-04-10
  Administered 2023-04-10: 975 mg via ORAL

## 2023-04-10 NOTE — ED Triage Notes (Signed)
 Meniscus surgery here on March 5. On two rounds of antibiotics with no relief to right knee. Joint feels warm and reddened. No antipyretics pta.

## 2023-04-11 ENCOUNTER — Encounter (HOSPITAL_COMMUNITY): Payer: Self-pay | Admitting: Physician Assistant

## 2023-04-11 LAB — GOLD TOP TUBE

## 2023-04-11 MED ORDER — CEFTRIAXONE 1 GRAM SOLUTION FOR INJECTION
INTRAMUSCULAR | Status: AC
Start: 2023-04-11 — End: 2023-04-11
  Filled 2023-04-11: qty 10

## 2023-04-11 MED ORDER — SODIUM CHLORIDE 0.9 % IV BOLUS
1000.0000 mL | INJECTION | Status: AC
Start: 2023-04-11 — End: 2023-04-11
  Administered 2023-04-11: 0 mL via INTRAVENOUS
  Administered 2023-04-11: 1000 mL via INTRAVENOUS

## 2023-04-11 MED ORDER — SODIUM CHLORIDE 0.9 % (FLUSH) INJECTION SYRINGE
3.0000 mL | INJECTION | INTRAMUSCULAR | Status: DC | PRN
Start: 2023-04-11 — End: 2023-04-11

## 2023-04-11 MED ORDER — SODIUM CHLORIDE 0.9 % (FLUSH) INJECTION SYRINGE
3.0000 mL | INJECTION | Freq: Three times a day (TID) | INTRAMUSCULAR | Status: DC
Start: 2023-04-11 — End: 2023-04-11

## 2023-04-11 MED ORDER — DOXYCYCLINE HYCLATE 100 MG CAPSULE
100.0000 mg | ORAL_CAPSULE | Freq: Two times a day (BID) | ORAL | 0 refills | Status: AC
Start: 2023-04-11 — End: 2023-04-21

## 2023-04-11 MED ORDER — SODIUM CHLORIDE 0.9 % INTRAVENOUS PIGGYBACK
1.0000 g | INTRAVENOUS | Status: AC
Start: 2023-04-11 — End: 2023-04-11
  Administered 2023-04-11: 0 g via INTRAVENOUS
  Administered 2023-04-11: 1 g via INTRAVENOUS

## 2023-04-11 NOTE — ED Provider Notes (Signed)
 [facility] - Emergency Department  ED Primary Provider Note  History of Present Illness   Chief Complaint   Patient presents with    Fever     Alec Friedman is a 53 y.o. male who had concerns including Fever.  Arrival: The patient arrived by Car    Patient is a 53 year old male who is 6 weeks status post right knee arthroscopy with Dr. Britta Candy presents emergency department with complaints of fever.  He states he has had some knee pain since the surgery.  States he has been 2 rounds of antibiotics for what sounds like superficial cellulitis of the port sites.  Patient started with fever today.  He has been having some mild shortness of breath.  He was started on Bactrim yesterday.      History Reviewed This Encounter: Medical History  Surgical History  Family History  Social History    Physical Exam   ED Triage Vitals [04/10/23 2036]   BP (Non-Invasive) 117/69   Heart Rate (!) 110   Respiratory Rate (!) 21   Temperature (!) 38.6 C (101.4 F)   SpO2 99 %   Weight 90.7 kg (200 lb)   Height 1.753 m (5\' 9" )     Physical Exam  Constitutional:       Appearance: Normal appearance.   HENT:      Head: Normocephalic.      Mouth/Throat:      Mouth: Mucous membranes are moist.   Eyes:      Extraocular Movements: Extraocular movements intact.      Pupils: Pupils are equal, round, and reactive to light.   Cardiovascular:      Rate and Rhythm: Normal rate and regular rhythm.      Pulses: Normal pulses.      Heart sounds: Normal heart sounds.   Pulmonary:      Effort: Pulmonary effort is normal.      Breath sounds: Normal breath sounds.   Musculoskeletal:      Comments: No significant effusion of the right knee there is some slight focal swelling of the left port site without erythema or induration no active drainage.  Able to flex and extend knee without significant pain.  No excessive warmth of the joint   Neurological:      Mental Status: He is alert.       Patient Data     Labs Ordered/Reviewed    COMPREHENSIVE METABOLIC PANEL, NON-FASTING - Abnormal; Notable for the following components:       Result Value    SODIUM 135 (*)     CREATININE 1.43 (*)     ESTIMATED GFR 59 (*)     ALBUMIN/GLOBULIN RATIO 1.6 (*)     CALCIUM, CORRECTED 8.8 (*)     All other components within normal limits    Narrative:     Estimated Glomerular Filtration Rate (eGFR) is calculated using the CKD-EPI (2021) equation, intended for patients 60 years of age and older. If gender is not documented or "unknown", there will be no eGFR calculation.     C-REACTIVE PROTEIN (CRP) - Abnormal; Notable for the following components:    C-REACTIVE PROTEIN (CRP) 6.0 (*)     All other components within normal limits   CBC WITH DIFF - Abnormal; Notable for the following components:    WBC 10.6 (*)     NEUTROPHIL % 89 (*)     LYMPHOCYTE % 5 (*)     EOSINOPHIL % 0 (*)  NEUTROPHIL # 9.50 (*)     LYMPHOCYTE # 0.50 (*)     All other components within normal limits   URINALYSIS, MACROSCOPIC - Abnormal; Notable for the following components:    KETONES 20 (*)     UROBILINOGEN 2 (*)     All other components within normal limits   URINALYSIS, MICROSCOPIC - Abnormal; Notable for the following components:    HYALINE CASTS 1 (*)     All other components within normal limits   SEDIMENTATION RATE - Normal    Narrative:     For adults in ED, MDW>20.0 may be associated with a higher risk of sepsis during the first 12 hrs of hospital admission     [id number] ON [id number] IN 0000/7   COVID-19, FLU A/B, RSV RAPID BY PCR - Normal    Narrative:     Results are for the simultaneous qualitative identification of SARS-CoV-2 (formerly 2019-nCoV), Influenza A, Influenza B, and RSV RNA. These etiologic agents are generally detectable in nasopharyngeal and nasal swabs during the ACUTE PHASE of infection. Hence, this test is intended to be performed on respiratory specimens collected from individuals with signs and symptoms of upper respiratory tract infection who meet Centers  for Disease Control and Prevention (CDC) clinical and/or epidemiological criteria for Coronavirus Disease 2019 (COVID-19) testing. CDC COVID-19 criteria for testing on human specimens is available at [facility] webpage information for Healthcare Professionals: Coronavirus Disease 2019 (COVID-19) ([email or web address]).     False-negative results may occur if the virus has genomic mutations, insertions, deletions, or rearrangements or if performed very early in the course of illness. Otherwise, negative results indicate virus specific RNA targets are not detected, however negative results do not preclude SARS-CoV-2 infection/COVID-19, Influenza, or Respiratory syncytial virus infection. Results should not be used as the sole basis for patient management decisions. Negative results must be combined with clinical observations, patient history, and epidemiological information. If upper respiratory tract infection is still suspected based on exposure history together with other clinical findings, re-testing should be considered.    Test methodology:   Cepheid Xpert Xpress SARS-CoV-2/Flu/RSV Assay real-time polymerase chain reaction (RT-PCR) test on the GeneXpert Dx and Xpert Xpress systems.   ADULT ROUTINE BLOOD CULTURE, SET OF 2 BOTTLES (BACTERIA AND YEAST)   ADULT ROUTINE BLOOD CULTURE, SET OF 2 BOTTLES (BACTERIA AND YEAST)   CBC/DIFF    Narrative:     The following orders were created for panel order CBC/DIFF.  Procedure                               Abnormality         Status                     ---------                               -----------         ------                     CBC WITH MWUX[[id number]]                Abnormal            Final result                 Please view results for these tests on the individual  orders.   URINALYSIS, MACROSCOPIC AND MICROSCOPIC W/CULTURE REFLEX    Narrative:     The following orders were created for panel order URINALYSIS, MACROSCOPIC AND  MICROSCOPIC W/CULTURE REFLEX [PRN ONLY].  Procedure                               Abnormality         Status                     ---------                               -----------         ------                     URINALYSIS, MACROSCOPIC[[id number]]      Abnormal            Final result               URINALYSIS, MICROSCOPIC[[id number]]      Abnormal            Final result                 Please view results for these tests on the individual orders.   EXTRA TUBES    Narrative:     The following orders were created for panel order EXTRA TUBES.  Procedure                               Abnormality         Status                     ---------                               -----------         ------                     BLUE TOP QION[[id number]]                                    Final result               GOLD TOP KGMW[[id number]]                                    Final result               GRAY TOP YQIH[[id number]]                                    Final result                 Please view results for these tests on the individual orders.   BLUE TOP TUBE   GOLD TOP TUBE   GRAY TOP TUBE     CT CHEST WO IV CONTRAST   Final Result by Edi, Radresults In (03/28 2353)   9 MM NODULE IN THE LINGULA.  FOLLOW-UP AFTER TREATMENT FOR INFECTION IS RECOMMENDED.         One or more dose reduction techniques were used (e.g., Automated exposure control, adjustment of the mA and/or kV according to patient size, use of iterative reconstruction technique).         Radiologist location ID: [id number]         XR AP MOBILE CHEST   Final Result by Edi, Radresults In (03/28 2118)   FINDINGS SUGGESTIVE OF LEFT LOWER LOBE PNEUMONIA.            Radiologist location ID: [id number]           Medical Decision Making        Medical Decision Making  CBC demonstrates mild leukocytosis metabolic panel does demonstrate some mild acute kidney injury.  Patient states he has been able to eat or drink much since starting the Bactrim.  CRP elevated to 6 ESR  within normal limits.  Does not appear to be a septic joint on exam.  Which demonstrated a left lower lobe infiltrate this is confirmed on CT demonstrating a nodule within the lingula.  Patient received L fluid bolus and Rocephin .  We will have patient discontinue Bactrim and start doxycycline .  We discussed return precautions.  He is recommended to follow up with the primary care for repeat labs as well as his orthopedic surgeon.  If any new or worsening symptoms prior to follow up he will return to the emergency department    Amount and/or Complexity of Data Reviewed  Labs: ordered.  Radiology: ordered.    Risk  OTC drugs.  Prescription drug management.                Medications Ordered/Administered in the ED   NS flush syringe (has no administration in time range)   NS flush syringe (has no administration in time range)   acetaminophen  (TYLENOL ) tablet (975 mg Oral Given 04/10/23 2135)   NS bolus infusion 1,000 mL (0 mL Intravenous Stopped 04/11/23 0131)   cefTRIAXone  (ROCEPHIN ) 1 g in NS 50 mL IVPB minibag (0 g Intravenous Stopped 04/11/23 0131)     Clinical Impression   Pneumonia (Primary)       Disposition: Discharged

## 2023-04-11 NOTE — ED Nurses Note (Signed)
 Patient discharged at this time. Patient given a copy of discharge paperwork to review. Patient verbalizes understanding of discharge instructions and all questions answered to satisfaction. IV removed at this time, IV intact upon removal, and pressure dressing applied. Patient out of ER at this time via ambulation.

## 2023-04-11 NOTE — Discharge Instructions (Signed)
 Stop taking the Bactrim (sulfamethoxazole-trimethoprim) start taking the new antibiotic doxycycline .  This may make you burn easier in the sun take precautions.  Drink plenty of fluids.  Your lab today demonstrates an increasing your kidney function you need to have this rechecked by your primary care to make sure your getting better.  Continue to follow up closely with the orthopedic team.  If you have any new or worsening symptoms prior to follow-up return to the emergency department

## 2023-04-15 LAB — ADULT ROUTINE BLOOD CULTURE, SET OF 2 BOTTLES (BACTERIA AND YEAST)
BLOOD CULTURE, ROUTINE: NO GROWTH
BLOOD CULTURE, ROUTINE: NO GROWTH

## 2023-08-06 ENCOUNTER — Emergency Department
Admission: EM | Admit: 2023-08-06 | Discharge: 2023-08-06 | Disposition: A | Discharge status: Home / Self Care | Attending: Emergency Medicine | Admitting: Emergency Medicine

## 2023-08-06 ENCOUNTER — Emergency Department (HOSPITAL_BASED_OUTPATIENT_CLINIC_OR_DEPARTMENT_OTHER)

## 2023-08-06 ENCOUNTER — Other Ambulatory Visit: Payer: Self-pay

## 2023-08-06 DIAGNOSIS — I4510 Unspecified right bundle-branch block: Secondary | ICD-10-CM | POA: Insufficient documentation

## 2023-08-06 DIAGNOSIS — M542 Cervicalgia: Secondary | ICD-10-CM | POA: Insufficient documentation

## 2023-08-06 DIAGNOSIS — Z9889 Other specified postprocedural states: Secondary | ICD-10-CM | POA: Insufficient documentation

## 2023-08-06 DIAGNOSIS — R5383 Other fatigue: Secondary | ICD-10-CM

## 2023-08-06 DIAGNOSIS — R519 Headache, unspecified: Secondary | ICD-10-CM | POA: Insufficient documentation

## 2023-08-06 DIAGNOSIS — R42 Dizziness and giddiness: Secondary | ICD-10-CM | POA: Insufficient documentation

## 2023-08-06 DIAGNOSIS — H538 Other visual disturbances: Secondary | ICD-10-CM

## 2023-08-06 DIAGNOSIS — I2510 Atherosclerotic heart disease of native coronary artery without angina pectoris: Secondary | ICD-10-CM | POA: Insufficient documentation

## 2023-08-06 DIAGNOSIS — J9811 Atelectasis: Secondary | ICD-10-CM

## 2023-08-06 DIAGNOSIS — Z981 Arthrodesis status: Secondary | ICD-10-CM | POA: Insufficient documentation

## 2023-08-06 DIAGNOSIS — Z8679 Personal history of other diseases of the circulatory system: Secondary | ICD-10-CM

## 2023-08-06 LAB — CBC WITH DIFF
BASOPHIL #: 0.03 x10ˆ3/uL (ref 0.00–0.10)
BASOPHIL %: 0 % (ref 0–1)
EOSINOPHIL #: 0.21 x10ˆ3/uL (ref 0.00–0.60)
EOSINOPHIL %: 3 % (ref 1–8)
HCT: 46.5 % (ref 36.7–47.1)
HGB: 16.2 g/dL (ref 12.5–16.3)
LYMPHOCYTE #: 1.64 x10ˆ3/uL (ref 1.00–3.00)
LYMPHOCYTE %: 26 % (ref 15–43)
MCH: 32.9 pg (ref 23.8–33.4)
MCHC: 34.7 g/dL (ref 32.5–36.3)
MCV: 94.7 fL (ref 73.0–96.2)
MONOCYTE #: 0.52 x10ˆ3/uL (ref 0.30–1.00)
MONOCYTE %: 8 % (ref 6–14)
MPV: 7.9 fL (ref 7.4–11.4)
NEUTROPHIL #: 3.84 x10ˆ3/uL (ref 1.85–7.84)
NEUTROPHIL %: 62 % (ref 44–74)
PLATELETS: 284 x10ˆ3/uL (ref 140–440)
RBC: 4.91 x10ˆ6/uL (ref 4.06–5.63)
RDW: 15 % (ref 12.1–16.2)
WBC: 6.2 x10ˆ3/uL (ref 3.6–10.2)

## 2023-08-06 LAB — COMPREHENSIVE METABOLIC PANEL, NON-FASTING
ALBUMIN/GLOBULIN RATIO: 1.4 (ref 0.8–1.4)
ALBUMIN: 4 g/dL (ref 3.4–5.0)
ALKALINE PHOSPHATASE: 116 U/L (ref 46–116)
ALT (SGPT): 22 U/L (ref <=78)
ANION GAP: 8 mmol/L (ref 4–13)
AST (SGOT): 19 U/L (ref 15–37)
BILIRUBIN TOTAL: 0.2 mg/dL (ref 0.2–1.0)
BUN/CREA RATIO: 13
BUN: 14 mg/dL (ref 7–18)
CALCIUM, CORRECTED: 8.9 mg/dL
CALCIUM: 8.9 mg/dL (ref 8.5–10.1)
CHLORIDE: 101 mmol/L (ref 98–107)
CO2 TOTAL: 28 mmol/L (ref 21–32)
CREATININE: 1.05 mg/dL (ref 0.70–1.30)
ESTIMATED GFR: 85 mL/min/1.73mˆ2 (ref >59)
GLOBULIN: 2.9
GLUCOSE: 104 mg/dL (ref 74–106)
OSMOLALITY, CALCULATED: 275 mosm/kg (ref 270–290)
POTASSIUM: 3.5 mmol/L (ref 3.5–5.1)
PROTEIN TOTAL: 6.9 g/dL (ref 6.4–8.2)
SODIUM: 137 mmol/L (ref 136–145)

## 2023-08-06 LAB — MAGNESIUM: MAGNESIUM: 1.9 mg/dL (ref 1.8–2.4)

## 2023-08-06 LAB — TROPONIN-I: TROPONIN I: 4 ng/L (ref <20)

## 2023-08-06 MED ORDER — PREDNISONE 50 MG TABLET
50.0000 mg | ORAL_TABLET | Freq: Every day | ORAL | 0 refills | Status: AC
Start: 2023-08-06 — End: 2023-08-11

## 2023-08-06 MED ORDER — METHOCARBAMOL 500 MG TABLET
1000.0000 mg | ORAL_TABLET | Freq: Four times a day (QID) | ORAL | 0 refills | Status: AC
Start: 2023-08-06 — End: 2023-08-16

## 2023-08-06 MED ORDER — ACETAMINOPHEN 1,000 MG/100 ML (10 MG/ML) INTRAVENOUS SOLUTION
1000.0000 mg | Freq: Four times a day (QID) | INTRAVENOUS | Status: DC | PRN
Start: 2023-08-06 — End: 2023-08-07
  Administered 2023-08-06: 1000 mg via INTRAVENOUS
  Administered 2023-08-06: 0 mg via INTRAVENOUS

## 2023-08-06 MED ORDER — ACETAMINOPHEN 1,000 MG/100 ML (10 MG/ML) INTRAVENOUS SOLUTION
INTRAVENOUS | Status: AC
Start: 2023-08-06 — End: 2023-08-06
  Filled 2023-08-06: qty 100

## 2023-08-06 MED ORDER — SODIUM CHLORIDE 0.9 % (FLUSH) INJECTION SYRINGE
3.0000 mL | INJECTION | INTRAMUSCULAR | Status: DC | PRN
Start: 2023-08-06 — End: 2023-08-07

## 2023-08-06 MED ORDER — METHOCARBAMOL 750 MG TABLET
1500.0000 mg | ORAL_TABLET | Freq: Once | ORAL | Status: AC
Start: 2023-08-06 — End: 2023-08-06
  Administered 2023-08-06: 1500 mg via ORAL

## 2023-08-06 MED ORDER — SODIUM CHLORIDE 0.9 % IV BOLUS
1000.0000 mL | INJECTION | Status: AC
Start: 2023-08-06 — End: 2023-08-06
  Administered 2023-08-06: 0 mL via INTRAVENOUS
  Administered 2023-08-06: 1000 mL via INTRAVENOUS

## 2023-08-06 MED ORDER — MECLIZINE 25 MG TABLET
25.0000 mg | ORAL_TABLET | Freq: Three times a day (TID) | ORAL | 0 refills | Status: AC | PRN
Start: 2023-08-06 — End: 2023-08-13

## 2023-08-06 MED ORDER — MECLIZINE 25 MG TABLET
25.0000 mg | ORAL_TABLET | ORAL | Status: AC
Start: 2023-08-06 — End: 2023-08-06
  Administered 2023-08-06: 25 mg via ORAL

## 2023-08-06 MED ORDER — MECLIZINE 25 MG TABLET
ORAL_TABLET | ORAL | Status: AC
Start: 2023-08-06 — End: 2023-08-06
  Filled 2023-08-06: qty 1

## 2023-08-06 MED ORDER — SODIUM CHLORIDE 0.9 % (FLUSH) INJECTION SYRINGE
3.0000 mL | INJECTION | Freq: Three times a day (TID) | INTRAMUSCULAR | Status: DC
Start: 2023-08-06 — End: 2023-08-07
  Administered 2023-08-06: 0 mL

## 2023-08-06 MED ORDER — METHOCARBAMOL 750 MG TABLET
ORAL_TABLET | ORAL | Status: AC
Start: 2023-08-06 — End: 2023-08-06
  Filled 2023-08-06: qty 2

## 2023-08-06 NOTE — ED Provider Notes (Signed)
 [facility], [place] - Emergency Department  ED Primary Note  History of Present Illness   Alec Friedman is a 53 y.o. male who had concerns including Dizziness. Pt states he has had ha and dizziness for a while worse tonight. State neck pain gets out of breath with exertion . States no nv no cp hx of mi and neck surg. Hx of vertigo feels different. Denies diff swallowing or talking but has diff walking at times  Review of Systems   Constitutional: No fever, chills or weakness   Skin: No rash or diaphoresis  HENT: + headaches, no  congestion  Eyes: No vision changes or photophobia   Cardio: No chest pain, palpitations or leg swelling   Respiratory: No cough, wheezing + exertional SOB  GI:  No nausea, vomiting or stool changes  GU:  No dysuria, hematuria, or increased frequency  MSK: No muscle aches, joint or back pain  Neuro: No seizures, LOC, numbness, tingling, or focal weakness+ dizziness.   Psychiatric: No depression, SI or substance abuse  All other systems reviewed and are negative.      Physical Exam   ED Triage Vitals   BP (Non-Invasive) 08/06/23 2200 (!) 144/92   Heart Rate 08/06/23 2206 67   Respiratory Rate 08/06/23 2206 16   Temperature 08/06/23 2206 36.5 C (97.7 F)   SpO2 08/06/23 2206 98 %   Weight 08/06/23 2206 90.7 kg (200 lb)   Height 08/06/23 2206 1.753 m (5' 9)     Constitutional:  53 y.o. male who appears in no distress. Normal color, no cyanosis.   HENT:   Head: Normocephalic and atraumatic.   Mouth/Throat: Oropharynx is clear and moist.   Eyes: EOMI, PERRL  no nystagmus   Neck: Trachea midline. Neck supple.  Cardiovascular: RRR, No murmurs, rubs or gallops. Intact distal pulses.  Pulmonary/Chest: BS equal bilaterally. No respiratory distress. No wheezes, rales or chest tenderness.   Abdominal: Bowel sounds present and normal. Abdomen soft, no tenderness, no rebound and no guarding.  Back: No midline spinal tenderness, no paraspinal tenderness, no CVA tenderness.            Musculoskeletal: No edema, tenderness or deformity.  Skin: warm and dry. No rash, erythema, pallor or cyanosis  Psychiatric: normal mood and affect. Behavior is normal.   Neurological: Patient keenly alert and responsive, easily able to raise eyebrows, facial muscles/expressions symmetric, speaking in fluent sentences, moving all extremities equally and fully, normal gait neg stroke scale   Patient Data   Labs Ordered/Reviewed   MAGNESIUM  - Normal   TROPONIN-I - Normal    Narrative:     Values received on females ranging between 12-15 ng/L MUST include the next serial troponin to review changes in the delta differences as the reference range for the Access II chemistry analyzer is lower than the established reference range.     CBC WITH DIFF - Normal   CBC/DIFF    Narrative:     The following orders were created for panel order CBC/DIFF.  Procedure                               Abnormality         Status                     ---------                               -----------         ------  CBC WITH IPQQ[[id number]]                Normal              Final result                 Please view results for these tests on the individual orders.   COMPREHENSIVE METABOLIC PANEL, NON-FASTING    Narrative:     Estimated Glomerular Filtration Rate (eGFR) is calculated using the CKD-EPI (2021) equation, intended for patients 8 years of age and older. If gender is not documented or unknown, there will be no eGFR calculation.   EXTRA TUBES    Narrative:     The following orders were created for panel order EXTRA TUBES.  Procedure                               Abnormality         Status                     ---------                               -----------         ------                     BLUE TOP ULAZ[[id number]]                                    In process                   Please view results for these tests on the individual orders.   BLUE TOP TUBE     XR CHEST AP   Final Result by Edi, Radresults In  (07/24 2326)   Mild left basilar atelectasis.                  Radiologist location ID: [id number]         CT BRAIN WO IV CONTRAST   Final Result by Edi, Radresults In (07/24 2243)    IMPRESSION:   No acute intracranial abnormality               Radiologist location ID: [id number]         CT CERVICAL SPINE WO IV CONTRAST   Final Result by Edi, Radresults In (07/24 2246)   No acute fracture or traumatic malalignment.      Uncomplicated postoperative appearance following C5-C6 ACDF.         Radiologist location ID: [id number]           Medical Decision Making   Diff dx of vertigo migraine dizziness. Atypical cardiac condition. TIA.    2300 care left to dr Meilah Delrosario.       Medications Ordered/Administered in the ED   NS flush syringe (0 mL Intracatheter Not Given 08/06/23 2330)   NS flush syringe (has no administration in time range)   acetaminophen  (OFIRMEV ) 1,000 mg (10 mg/mL) IV 100 mL (tot vol) (0 mg Intravenous Stopped 08/06/23 2303)   meclizine  (ANTIVERT ) tablet (25 mg Oral Given 08/06/23 2247)   NS bolus infusion 1,000 mL (1,000 mL Intravenous New Bag/New Syringe 08/06/23 2226)   methocarbamol  (ROBAXIN ) tablet (1,500 mg Oral Given 08/06/23  2247)     Clinical Impression   Headache (Primary)   Neck pain, musculoskeletal   Vertigo   History of CAD (coronary artery disease)   Personal history of spine surgery       Disposition: Discharged

## 2023-08-06 NOTE — ED Nurses Note (Signed)
 Pt offered any comfort measures (food, water, repositioning, restroom, etc.). Pt expresses no other concerns or needs at this time. See physical assessment. Plan of care ongoing. Call light in reach.

## 2023-08-06 NOTE — ED Attending Note (Signed)
 Patient transferred to service at 11:00 p.m. chest x-ray CT head CT cervical spine negative baseline labs within normal range patient discharged home follow-up outpatient with primary physician

## 2023-08-06 NOTE — ED Nurses Note (Signed)
 Patient discharged home with family/self with treatment of initial complaint upon arrival to ER.  AVS reviewed by physician with patient/care giver.  A written copy of the AVS and discharge instructions was given to the patient/care giver. Scripts handed to patient/care giver or sent to pharmacy indicated on AVS print out. Pt verbalized understanding of taking any medications as prescribed. Questions and concerns sufficiently answered as needed.  Patient/care giver encouraged to follow up with PCP as indicated.  In the event of an emergency, patient/care giver instructed to call 911 or go to the nearest emergency room. No other concerns voiced at time of discharge to physician or nurse. Respiration even and unlabored. Pt ambulatory out of ED.

## 2023-08-07 DIAGNOSIS — I4519 Other right bundle-branch block: Secondary | ICD-10-CM

## 2023-08-07 DIAGNOSIS — R9431 Abnormal electrocardiogram [ECG] [EKG]: Secondary | ICD-10-CM

## 2023-08-07 LAB — ECG 12 LEAD
Atrial Rate: 64 {beats}/min
Calculated P Axis: 54 degrees
Calculated R Axis: 71 degrees
Calculated T Axis: 69 degrees
PR Interval: 132 ms
QRS Duration: 100 ms
QT Interval: 424 ms
QTC Calculation: 437 ms
Ventricular rate: 64 {beats}/min

## 2023-12-17 ENCOUNTER — Ambulatory Visit: Payer: Self-pay | Attending: Family | Admitting: SLEEP MEDICINE

## 2023-12-17 ENCOUNTER — Other Ambulatory Visit: Payer: Self-pay

## 2023-12-17 ENCOUNTER — Encounter (HOSPITAL_COMMUNITY): Payer: Self-pay | Admitting: SLEEP MEDICINE

## 2023-12-17 VITALS — BP 127/68 | HR 77 | Temp 97.3°F | Resp 15 | Ht 69.0 in | Wt 210.0 lb

## 2023-12-17 DIAGNOSIS — G2581 Restless legs syndrome: Secondary | ICD-10-CM

## 2023-12-17 DIAGNOSIS — R0600 Dyspnea, unspecified: Secondary | ICD-10-CM | POA: Insufficient documentation

## 2023-12-17 DIAGNOSIS — R0683 Snoring: Secondary | ICD-10-CM | POA: Insufficient documentation

## 2023-12-17 DIAGNOSIS — F419 Anxiety disorder, unspecified: Secondary | ICD-10-CM | POA: Insufficient documentation

## 2023-12-17 DIAGNOSIS — M549 Dorsalgia, unspecified: Secondary | ICD-10-CM | POA: Insufficient documentation

## 2023-12-17 DIAGNOSIS — G4700 Insomnia, unspecified: Secondary | ICD-10-CM

## 2023-12-17 DIAGNOSIS — R918 Other nonspecific abnormal finding of lung field: Secondary | ICD-10-CM | POA: Insufficient documentation

## 2023-12-17 DIAGNOSIS — G4733 Obstructive sleep apnea (adult) (pediatric): Secondary | ICD-10-CM

## 2023-12-17 DIAGNOSIS — G8929 Other chronic pain: Secondary | ICD-10-CM | POA: Insufficient documentation

## 2023-12-17 NOTE — H&P (Signed)
 PULMONARY AND SLEEP DISORDERS, [facility]  [address]  [place] [address]  Operated by [facility]     New Patient Sleep Note    Patient Name: Alec Friedman  Date: 12/17/2023  Department:  PULMONARY AND SLEEP DISORDERS, [facility]  MRN: [id number]  DOB: [birth year]  Primary Care Provider:  Joen LITTIE Hazel, APRN, CNP  Referring Provider:  Joen LITTIE Hazel, APRN, CNP    Chief Complaint:   Chief Complaint   Patient presents with    Establish Care     Pt presents to establish care. He was referred to the clinic by [facility] due to concern for OSA. He c/o snoring, broken sleep, fatigue and daytime sleepiness.          History of Present Illness  Alec Friedman is a 53 year old male who presents with sleep disturbances and suspected obstructive sleep apnea. He was referred for evaluation of sleep issues, including snoring and frequent awakenings.    He experiences significant sleep disturbances characterized by frequent awakenings and an inability to sleep for two to three days at a time. Episodes of waking up feeling smothered and unable to breathe lead to fear and prevent him from returning to sleep. He often engages in activities to avoid going back to sleep but feels tired and sleepy throughout the day, even after a night of good sleep.    He has a history of restless leg syndrome and takes Requip at bedtime. He also takes Belsomra regularly to aid sleep. Pain from previous surgeries on his back, neck, and hand contributes to his sleep difficulties. He is scheduled for a knee replacement in February.    He has experienced sleepwalking once. No history of asthma or COPD. He mentions a slight heart attack in the past, discovered during a pre-surgical evaluation, but does not currently experience any heart-related symptoms.    He has a history of exposure to chemicals and paints, which he believes may have caused scarring in his lungs. He reports  shortness of breath with both short and long distances. In March 2025, a CT scan of his chest showed a 9mm opacity, and a follow-up chest x-ray in July showed some left basilar opacity. He has no history of lung problems but notes a significant change in his breathing capacity.     Epworth assessment done in office today with a total score of 4.     Occasional shortness of breath with exertion like walking long distance.      Never smoker        Results  RADIOLOGY     Chest CT done 04/10/23 showing There is a nodule or groundglass density in the lingula measuring 9 mm. A small focus of infection is not excluded. Follow-up after treatment to document resolution should be performed. There are bands of atelectasis in the lower lobes.  The airway is patent.  No pleural effusion.  No pneumothorax.    Chest x-ray 08/06/23 showing Mild linear left basilar opacity favors atelectasis. No pleural effusions or pneumothorax. 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    HX APPENDECTOMY      HX BACK SURGERY      HX CHOLECYSTECTOMY      KNEE SURGERY Right     NECK SURGERY      SHOULDER SURGERY Left      Medication List  Current Outpatient Medications   Medication Sig    ALPRAZolam (XANAX) 0.5 mg Oral Tablet Take 1 Tablet (0.5 mg total) by mouth Three times a day    BELSOMRA 10 mg Oral Tablet Take 1 Tablet (10 mg total) by mouth Every night as needed    buPROPion (WELLBUTRIN SR) 150 mg Oral tablet sustained-release 12 hr Take 1 Tablet (150 mg total) by mouth Twice daily    cyanocobalamin (VITAMIN B12) 1,000 mcg/mL Injection  Solution Inject 1 mL (1,000 mcg total) under the skin Every 14 days    diclofenac  sodium (VOLTAREN ) 75 mg Oral Tablet, Delayed Release (E.C.) Take 1 Tablet (75 mg total) by mouth Twice daily    gabapentin (NEURONTIN) 300 mg Oral Capsule Take 1 Capsule (300 mg total) by mouth Every night    gabapentin (NEURONTIN) 800 mg Oral Tablet Take 1 Tablet (800 mg total) by mouth Three times a day    HYDROcodone -acetaminophen  (NORCO) 10-325 mg Oral Tablet Take 1 Tablet by mouth Every 6 hours as needed for Pain    HYDROcodone -acetaminophen  (NORCO) 7.5-325 mg Oral Tablet Take 1 Tablet by mouth Every 4 hours as needed for Pain    meloxicam (MOBIC) 15 mg Oral Tablet Take 1 Tablet (15 mg total) by mouth Once a day    MOVANTIK 25 mg Oral Tablet Take 1 Tablet (25 mg total) by mouth Every morning    rOPINIRole (REQUIP) 3 mg Oral Tablet Take 1 Tablet (3 mg total) by mouth Every night    venlafaxine (EFFEXOR XR) 75 mg Oral Capsule, Sust. Release 24 hr Take 1 Capsule (75 mg total) by mouth Once a day    verapamiL (CALAN) 80 mg Oral Tablet Take 1 Tablet (80 mg total) by mouth Twice daily 2 tab in am and 1 tab evening     Allergy List  Allergy History as of 12/18/23        No Known Allergies                  Family History   Family Medical History:    None         Social History  Social History     Socioeconomic History    Marital status: Divorced     Spouse name: Not on file    Number of children: Not on file    Years of education: Not on file    Highest education level: Not on file   Occupational History    Not on file   Tobacco Use    Smoking status: Never     Passive exposure: Never    Smokeless tobacco: Current     Types: Chew   Vaping Use    Vaping status: Never Used   Substance and Sexual Activity    Alcohol use: Never    Drug use: Never    Sexual activity: Not on file   Other Topics Concern    Ability to Walk 1 Flight  of Steps without SOB/CP No    Routine Exercise No    Ability to Walk 2 Flight of Steps without SOB/CP No    Unable  to Ambulate No    Total Care No    Ability To Do Own ADL's Yes    Uses Walker No    Other Activity Level Not Asked    Uses Cane Yes   Social History Narrative    Not on file     Social Determinants of Health     Financial Resource Strain: Not on file   Transportation Needs: Not on file   Social Connections: Not on file   Intimate Partner Violence: Not on file   Housing Stability: Not on file       Review of system  General:  Denies fever, chills, night sweats, loss of appetite.  Neurological:  Denies dizziness or seizures.  Ears, Nose, Throat: Denies ear pain, nasal/sinus congestion or pain, or sore throat.   Gastrointestinal:  Denies uncontrolled reflux, heartburn, nausea, or diarrhea.  Cardiovascular:  Denies chest pain or irregular heartbeats.  Respiratory: see HPI  Musculoskeletal:  Has chronic back pain.  Endocrine/Metabolic:  Denies weight gain or weight loss.  Mental Status/Psychiatric:  Denies uncontrolled anxiety or depression.  Integumentary:  Denies rash or new abnormal skin lesions.    Vital Signs  Vitals:    12/17/23 1727   BP: 127/68   Pulse: 77   Resp: 15   Temp: 36.3 C (97.3 F)   TempSrc: Temporal   SpO2: 95%   Weight: 95.3 kg (210 lb)   Height: 1.753 m (5' 9)   BMI: 31.01     Neck (Inches): 15.5    Physical Exam  GENERAL APPEARANCE OF THE PT: Alert, no acute distress. Normal appearance, well nourished.  EYES: Normal eye lids. Conjunctiva normal.  EARS NOSE MOUTH AND THROAT: External inspection of ears and nose with normal appearance. Nares patent.  NECK: Supple with trachea midline, non tender, no nodules, no masses, gland position midline.  RESPIRATORY: Lungs distant but clear to auscultation, normal breath sounds, no rales, no rhonchi, no wheezing. Respiratory effort with no retractions, breathing regular and unlabored.  CARDIOVASCULAR: Heart sounds normal with regular rhythm and regular rate. No murmur, no peripheral edema.  MUSCULOSKELETAL: Normal gait and station, normal digits, no digital  cyanosis or clubbing.  MENTAL STATUSPSYCHIATRIC: Alert- oriented to person, place, and time. Appropriate and normal mood.         ICD-10-CM    1. Insomnia, unspecified type  G47.00       2. OSA (obstructive sleep apnea)  G47.33 POLYSOMNOGRAPHY-ANP OVERNIGHT-1ST NIGHT OF STUDY (F/U WITH CPAP IF ANP MEETS CLINICAL CRITERIA)      3. Snoring  R06.83       4. Abnormal CT scan of lung  R91.8 CT Chest WO      5. Dyspnea, unspecified type  R06.00 POLYSOMNOGRAPHY-ANP OVERNIGHT-1ST NIGHT OF STUDY (F/U WITH CPAP IF ANP MEETS CLINICAL CRITERIA)      6. Anxiety  F41.9       7. Chronic back pain  M54.9     G89.29       8. Restless leg syndrome  G25.81          Assessment & Plan  Obstructive sleep apnea  Suspected due to symptoms of snoring, frequent awakenings, and feeling of suffocation during sleep. Differential includes mild sleep apnea and potential need for CPAP or Inspire device. Discussed CPAP as the gold standard treatment,  with options for mandibular device for mild cases and Inspire device for those intolerant to CPAP. Explained CPAP involves wearing a mask to maintain airway patency, while Elizabeth is a surgical option for those unable to tolerate CPAP. Discussed potential benefits of side sleeping and Flonase nasal spray to reduce airway inflammation.  - Scheduled in-lab sleep study to confirm diagnosis and assess severity.  - Instructed to withhold bedtime medications until after sleep study setup on night of study.  But will need to bring with him to study to take at bedtime.  - Discussed potential use of CPAP or Inspire device based on sleep study results.    Dyspnea  Reports shortness of breath with both short and long distances. No significant decrease in heart function noted, but possible scarring from past exposure to chemicals and past myocardial infarction. Discussed potential impact of lung scarring on breathing and need for further evaluation.  - Ordered chest CT to evaluate lung condition and assess for  scarring or other abnormalities.    Abnormal lung imaging  Previous CT scan showed opacity in the left basilar region, possibly due to past pneumonia or scarring from chemical exposure. Follow-up imaging needed to assess resolution or persistence of abnormalities.  - Ordered follow-up chest CT to evaluate resolution of previous opacity and assess current lung status.    Restless legs syndrome  Managed with Requip at bedtime. Symptoms include jerking movements and discomfort, particularly at bedtime. Discussed potential impact of pain from previous surgeries on symptoms.  - Continue Requip at bedtime for symptom management.    Insomnia  Chronic insomnia with difficulty maintaining sleep, exacerbated by pain and anxiety. Uses Belsomra for sleep aid. Discussed potential benefits of noise machines or TV to aid sleep by reducing anxiety and providing background noise.  - Continue Belsomra for sleep aid as ordered by previous provider but cautioned on how this medication can cause deeper sleep and more risk of apnea events.    - Consider using noise machines or TV to aid sleep by reducing anxiety.    Orders Placed This Encounter    CT Chest WO    POLYSOMNOGRAPHY-ANP OVERNIGHT-1ST NIGHT OF STUDY (F/U WITH CPAP IF ANP MEETS CLINICAL CRITERIA)        Continue medications as prescribed/directed unless changed by provider.  Plan of care discussed with patient.    Return for after all testing.. Patient was advised to come back earlier if any symptoms get worse.  Also advised to come back for results of any test done.  If unable to keep regular appointment, patient was advised to schedule another appointment as soon as possible.     The patient was given the opportunity to ask questions and those questions were answered to the patient's satisfaction. The patient was encouraged to call with any additional questions or concerns. Discussed with patient effects and side effects of medications. Medication safety was discussed.  The  patient was informed to contact the office within 7 business days if a message/lab results/referral/imaging results have not been conveyed to the patient.    Electronically signed by Ronal Kerns, APRN, CNP   Pulmonary and Critical care    This note was created with assistance from [employer] via capture of conversational audio. Consent was obtained from the patient and all parties present prior to recording.      This note may have been partially generated using MModal Fluency Direct system, and there may be some incorrect words, spellings, and punctuation that were not noted  in checking the note before saving.

## 2023-12-18 ENCOUNTER — Ambulatory Visit
Admission: RE | Admit: 2023-12-18 | Discharge: 2023-12-18 | Disposition: A | Discharge status: Home / Self Care | Source: Ambulatory Visit | Attending: Orthopaedic Surgery | Admitting: Orthopaedic Surgery

## 2023-12-18 ENCOUNTER — Other Ambulatory Visit (HOSPITAL_COMMUNITY): Payer: Self-pay | Admitting: Orthopaedic Surgery

## 2023-12-18 ENCOUNTER — Ambulatory Visit (HOSPITAL_COMMUNITY)

## 2023-12-18 DIAGNOSIS — Z131 Encounter for screening for diabetes mellitus: Secondary | ICD-10-CM

## 2023-12-18 DIAGNOSIS — R82991 Hypocitraturia: Secondary | ICD-10-CM | POA: Insufficient documentation

## 2023-12-18 DIAGNOSIS — Z01818 Encounter for other preprocedural examination: Secondary | ICD-10-CM

## 2023-12-18 DIAGNOSIS — M1711 Unilateral primary osteoarthritis, right knee: Secondary | ICD-10-CM

## 2023-12-18 DIAGNOSIS — M25561 Pain in right knee: Secondary | ICD-10-CM

## 2023-12-18 LAB — URINALYSIS, MICROSCOPIC
RBCS: 1 /HPF (ref <4)
WBCS: 1 /HPF (ref <6)

## 2023-12-18 LAB — CBC
HCT: 35.1 % — ABNORMAL LOW (ref 36.7–47.1)
HGB: 12 g/dL — ABNORMAL LOW (ref 12.5–16.3)
MCH: 31.5 pg (ref 23.8–33.4)
MCHC: 34.3 g/dL (ref 32.5–36.3)
MCV: 92 fL (ref 73.0–96.2)
MPV: 9 fL (ref 7.4–11.4)
PLATELETS: 377 x10ˆ3/uL (ref 140–440)
RBC: 3.81 x10ˆ6/uL — ABNORMAL LOW (ref 4.06–5.63)
RDW: 13.4 % (ref 12.1–16.2)
WBC: 5.1 x10ˆ3/uL (ref 3.6–10.2)

## 2023-12-18 LAB — URINALYSIS, MACROSCOPIC
BILIRUBIN: NEGATIVE mg/dL
BLOOD: NEGATIVE mg/dL
GLUCOSE: NEGATIVE mg/dL
KETONES: NEGATIVE mg/dL
LEUKOCYTES: NEGATIVE WBCs/uL
NITRITE: NEGATIVE
PH: 5.5 (ref 5.0–9.0)
PROTEIN: NEGATIVE mg/dL
SPECIFIC GRAVITY: 1.029 (ref 1.002–1.030)
UROBILINOGEN: 2 mg/dL — AB

## 2023-12-18 LAB — BASIC METABOLIC PANEL
ANION GAP: 5 mmol/L (ref 4–13)
BUN/CREA RATIO: 24 — ABNORMAL HIGH (ref 6–22)
BUN: 22 mg/dL (ref 7–25)
CALCIUM: 8.9 mg/dL (ref 8.6–10.3)
CHLORIDE: 104 mmol/L (ref 98–107)
CO2 TOTAL: 29 mmol/L (ref 21–31)
CREATININE: 0.91 mg/dL (ref 0.60–1.30)
ESTIMATED GFR: 101 mL/min/1.73mˆ2 (ref >59)
GLUCOSE: 99 mg/dL (ref 74–109)
OSMOLALITY, CALCULATED: 279 mosm/kg (ref 270–290)
POTASSIUM: 4.2 mmol/L (ref 3.5–5.1)
SODIUM: 138 mmol/L (ref 136–145)

## 2023-12-18 LAB — HGA1C (HEMOGLOBIN A1C WITH EST AVG GLUCOSE): HEMOGLOBIN A1C: 5.4 % (ref 4.0–6.0)

## 2023-12-19 DIAGNOSIS — Z01810 Encounter for preprocedural cardiovascular examination: Secondary | ICD-10-CM

## 2023-12-19 DIAGNOSIS — I498 Other specified cardiac arrhythmias: Secondary | ICD-10-CM

## 2023-12-19 DIAGNOSIS — R9431 Abnormal electrocardiogram [ECG] [EKG]: Secondary | ICD-10-CM

## 2023-12-19 LAB — ECG 12 LEAD
Calculated R Axis: 19 degrees
Calculated T Axis: -10 degrees
QRS Duration: 100 ms
QT Interval: 410 ms
QTC Calculation: 410 ms
Ventricular rate: 60 {beats}/min

## 2023-12-25 ENCOUNTER — Ambulatory Visit (HOSPITAL_BASED_OUTPATIENT_CLINIC_OR_DEPARTMENT_OTHER)

## 2024-01-22 ENCOUNTER — Ambulatory Visit (HOSPITAL_COMMUNITY)
Admission: RE | Admit: 2024-01-22 | Discharge: 2024-01-22 | Disposition: A | Source: Ambulatory Visit | Attending: Orthopaedic Surgery

## 2024-01-22 ENCOUNTER — Ambulatory Visit
Admission: RE | Admit: 2024-01-22 | Discharge: 2024-01-22 | Disposition: A | Payer: Self-pay | Source: Ambulatory Visit | Attending: SLEEP MEDICINE

## 2024-01-22 ENCOUNTER — Other Ambulatory Visit: Payer: Self-pay

## 2024-01-22 DIAGNOSIS — G4733 Obstructive sleep apnea (adult) (pediatric): Secondary | ICD-10-CM | POA: Insufficient documentation

## 2024-01-22 DIAGNOSIS — M25561 Pain in right knee: Secondary | ICD-10-CM | POA: Insufficient documentation

## 2024-01-22 DIAGNOSIS — M1711 Unilateral primary osteoarthritis, right knee: Secondary | ICD-10-CM | POA: Insufficient documentation

## 2024-01-22 DIAGNOSIS — R0600 Dyspnea, unspecified: Secondary | ICD-10-CM | POA: Insufficient documentation

## 2024-01-25 DIAGNOSIS — G4733 Obstructive sleep apnea (adult) (pediatric): Secondary | ICD-10-CM

## 2024-01-25 NOTE — Procedures (Signed)
 [facility]    Sleep Study    Patient Name: Alec Friedman  Date of Birth: 1970/02/27  Gender: male  Provider: Franky Bryant, MD  Location: [facility]  Location Address: [address] [place], [address]  [facility]  Location Phone: [phone] [facility]      Primary Care Provider: Joen LITTIE Hazel, APRN, CNP  Referring Provider: Ronal Kerns, NP    Sleep Study    Chief Complaint    Past Medical History  Past Medical History:   Diagnosis Date    Back pain     Chronic pain     Kidney stones     MI (myocardial infarction)     Muscle weakness     Neck problem     Nerve damage     neck, spine, legs    Vitamin B 12 deficiency            Past Surgical History  Past Surgical History:   Procedure Laterality Date    HAND RECONSTRUCTION Right     middle, ring, and little finger amputation, fingers reconstructed back on hand    HX APPENDECTOMY      HX BACK SURGERY      HX CHOLECYSTECTOMY      KNEE SURGERY Right     NECK SURGERY      SHOULDER SURGERY Left            Medication LIst  No outpatient medications have been marked as taking for the 01/22/24 encounter [facility] Encounter) with SLEEP LAB, RM 1 PRN NEURODIAGNOSTICS.       Allergy List  Allergies[1]    Family Medical History  Family Medical History:    None           Social History  Social History     Socioeconomic History    Marital status: Divorced   Tobacco Use    Smoking status: Never     Passive exposure: Never    Smokeless tobacco: Current     Types: Chew   Vaping Use    Vaping status: Never Used   Substance and Sexual Activity    Alcohol use: Never    Drug use: Never   Other Topics Concern    Ability to Walk 1 Flight of Steps without SOB/CP No    Routine Exercise No    Ability to Walk 2 Flight of Steps without SOB/CP No    Unable to Ambulate No    Total Care No    Ability To Do Own ADL's Yes    Uses Walker No    Uses Cane Yes       Patient Information  Sleep apnea symptoms: Excessive Daytime Sleepiness and Snoring  Patient's Height:  69.0  Patient's Weight: 210.0  Patient's BMI: 31.0  Neck Circumference: 15.5  Epworth Scale Score: 4.0    Date of Study: 01/22/2024  Study Performed By: VEAR. Delilah, LPN   Study Reviewed By: Charlies Mulders, RRT, RPSGT, [facility]    Procedure : Procedure: Complete PSG with digital sleep system using the international 10-20 electrode placement for recording EEG, EOG, EMG from chin, ECG, Respiratory effort, oximetry, body position, airflow, snoring sound, pulse rate, and limb movement channels.    Sleep Architecture:   Total Recording Time: 331.0 min.  Total Sleep Time: 304.5 min.   Sleep Efficiency Percentage: 83.4%.   Patient Sleep Latency (Minutes): 5.0 min.   Patient REM Latency (Minutes): 133.5 min.   Percentage of  Stage I Sleep:  1.6 %.   Percentage of Stage II Sleep: 89.3 %.   Percentage of Stage III Sleep: 0.0 %.   Percentage (%) of REM sleep: 9.0 %.     Respiratory Data:   Number of Obstructive Apneas Observed: 4.0 .   Number of Central Apneas Observed: 0.0.   Number of Mixed Apneas Observed: 0.0.   Total Number of Apneas: 4.0.   Total number of Hypopneas: 9.0.   Snoring During the Study: Yes, mild    Apnea Hypopnea Index (AHI) per hour: 2.6.   Time Patient Spent in Supine Position: 155.8 mins.  AHI in Supine Position #: 5.3.   Non-supine AHI #: 0.0.   REM AHI #: 8.7.   Non-REM AHI #:  1.9.   Lowest Desaturation Percentage: 83%.   Total Amount of Desaturated Time Below or Equal to 8.7 mins .        Cardiac Data:   The Mean Heart Rate During the Study (Beats/Min): 73.1.   Sinus Rhythm: Normal     Periodic Leg Movements:   Total Number of Periodic Leg Movements During the Study: 7.   PLM Index #: 1.4.    The study was scored by: 30 second EPOCH, 4 % desaturation and  CMS guidelines.AASM Accredited     Assessment:   Diagnosis:   (R09.02) Hypoxemia ([id number]), (R06.83) Snoring (SCT), and (G47.33) OSA - Obstructive sleep apnea (SCT)    PLAN  Procedure Orders  No significant OSA. Follow up in clinic.     Instructions:    Positional Therapy may be helpful in snoring and Nasal steroids may be helpful for primary snoring    Impression  Ahi 2.6 per hour and No significant  OSA  Lowest recorded SpO2 83% below 88% for 8.7 minutes.   No stage 3 sleep noted.  Sleep efficiency 83.4%.    Aneta Charlies Mulders, RRT, RPSGT, [facility]    Sleep attending  I reviewed the raw data from the sleep study in detail and agree with the above.    No significant OSA was observed.    Follow up as previously scheduled    Franky LITTIE Bryant, MD  01/25/2024 15:20           [1] No Known Allergies

## 2024-01-27 ENCOUNTER — Ambulatory Visit (HOSPITAL_BASED_OUTPATIENT_CLINIC_OR_DEPARTMENT_OTHER)

## 2024-01-28 ENCOUNTER — Other Ambulatory Visit: Payer: Self-pay

## 2024-01-28 ENCOUNTER — Ambulatory Visit
Admission: RE | Admit: 2024-01-28 | Discharge: 2024-01-28 | Disposition: A | Discharge status: Home / Self Care | Source: Ambulatory Visit | Attending: SLEEP MEDICINE | Admitting: SLEEP MEDICINE

## 2024-01-28 DIAGNOSIS — R918 Other nonspecific abnormal finding of lung field: Secondary | ICD-10-CM | POA: Insufficient documentation

## 2024-02-01 ENCOUNTER — Telehealth (HOSPITAL_COMMUNITY): Payer: Self-pay | Admitting: SLEEP MEDICINE

## 2024-02-03 ENCOUNTER — Encounter (INDEPENDENT_AMBULATORY_CARE_PROVIDER_SITE_OTHER): Payer: Self-pay

## 2024-02-03 ENCOUNTER — Other Ambulatory Visit (HOSPITAL_COMMUNITY): Payer: Self-pay | Admitting: SLEEP MEDICINE

## 2024-02-03 ENCOUNTER — Encounter (HOSPITAL_COMMUNITY): Payer: Self-pay | Admitting: Orthopaedic Surgery

## 2024-02-03 ENCOUNTER — Encounter (HOSPITAL_COMMUNITY): Payer: Self-pay

## 2024-02-03 DIAGNOSIS — G4734 Idiopathic sleep related nonobstructive alveolar hypoventilation: Secondary | ICD-10-CM

## 2024-02-03 DIAGNOSIS — J984 Other disorders of lung: Secondary | ICD-10-CM

## 2024-02-03 DIAGNOSIS — R0600 Dyspnea, unspecified: Secondary | ICD-10-CM

## 2024-02-03 DIAGNOSIS — G479 Sleep disorder, unspecified: Secondary | ICD-10-CM

## 2024-02-03 NOTE — Progress Notes (Signed)
 Polysomnography results reviewed from 01/22/24 and results showing desaturation of oxygen with lowest at 83 % And staying at or below 88% for 8.7 Minutes.  Patient shown to have no significant obstructive sleep apnea (OSA) with AHI at 2.6/hour.    Will need O2 ordered for night-time use.  Will need PFT    Chest CT done 01/28/24 showing No concerning nodule. Prior minimal nodular density in the lingula is no longer seen.  Minimal linear atelectasis or scarring in the bilateral lower lobe and lingula. Lungs and airways are otherwise clear.  Pleura: No pleural effusion.  No pneumothorax.     02/03/24- Pulmonary surgical clearance included follow up on lungs and sleep.  Follow up chest CT required due to a previous nodular density.  New CT showing only some scarring, but the previous density no longer seen. Home sleep study did rule out sleep apnea but found hypoxemia during sleep.  This patient now  requires and would benefit from use of Oxygen at 2 LPM during sleep.      ARISCAT score of 27 making this patient an intermediate risk of post-op complications.  I recommend use of oxygen during and after surgery with any sleeping.  Also recommend good pulmonary hygiene and quick start to therapy after surgery.

## 2024-02-03 NOTE — Anesthesia Preprocedure Evaluation (Signed)
 ANESTHESIA PRE-OP EVALUATION  Planned Procedure: MAKO ROBOTIC ASSISTED RIGHT TOTAL KNEE ARTHROPLASTY USING TRIATHLON IMPLANTS (Right: Knee)  Review of Systems     anesthesia history negative     patient summary reviewed          Pulmonary   home oxygen and current smoker (chew),   Cardiovascular    Past MI, ECG reviewed, 01/2023 1.Normal study.  Regadenoson  myocardial perfusion SPECT study with Tc 53m Cardiolite revealed no significant reversibility or ischemia.  There was no infarct and calculated left ventricular ejection fraction was normal. and ACE / ARB inhibitor use , Exercise Tolerance: <4 METS        GI/Hepatic/Renal    kidney stones        Endo/Other   neg endo/other ROS,       Neuro/Psych/MS    back abnormality, Neck problems     Cancer    negative hematology/oncology ROS,                     Physical Assessment      Airway       Mallampati: II      Neck ROM: limited  Mouth Opening: good.  Facial hair  Beard        Dental                    Pulmonary    Breath sounds clear to auscultation       Cardiovascular    Rhythm: regular  Rate: Normal       Other findings              Plan  ASA 3     Planned anesthesia type: spinal                         Intravenous induction     Anesthesia issues/risks discussed are: Dental Injuries, Nerve Injuries, PONV, Cardiac Events/MI, Intraoperative Awareness/ Recall, Sore Throat, Local Anesthetic Systemic Toxicity, Failure of Block, Spinal Headache, High Neuraxial Block, Aspiration, Stroke and Post-op Pain Management.  Anesthetic plan and risks discussed with patient  signed consent obtained          Patient's NPO status is appropriate for Anesthesia.           (GA / LMA.  Discussed with patient.  All questions answered )

## 2024-02-04 ENCOUNTER — Encounter (HOSPITAL_COMMUNITY): Admitting: Anesthesiology

## 2024-02-04 ENCOUNTER — Inpatient Hospital Stay (HOSPITAL_COMMUNITY): Admitting: Anesthesiology

## 2024-02-04 ENCOUNTER — Observation Stay
Admission: RE | Admit: 2024-02-04 | Discharge: 2024-02-05 | Disposition: A | Discharge status: Home / Self Care | Source: Ambulatory Visit | Attending: Orthopaedic Surgery | Admitting: Orthopaedic Surgery

## 2024-02-04 ENCOUNTER — Other Ambulatory Visit: Payer: Self-pay

## 2024-02-04 ENCOUNTER — Observation Stay (HOSPITAL_COMMUNITY): Admitting: Orthopaedic Surgery

## 2024-02-04 ENCOUNTER — Encounter (HOSPITAL_COMMUNITY)
Admission: RE | Disposition: A | Discharge status: Home / Self Care | Payer: Self-pay | Source: Ambulatory Visit | Attending: Orthopaedic Surgery

## 2024-02-04 ENCOUNTER — Encounter (HOSPITAL_COMMUNITY): Payer: Self-pay | Admitting: Orthopaedic Surgery

## 2024-02-04 DIAGNOSIS — G2581 Restless legs syndrome: Secondary | ICD-10-CM | POA: Insufficient documentation

## 2024-02-04 DIAGNOSIS — F419 Anxiety disorder, unspecified: Secondary | ICD-10-CM | POA: Insufficient documentation

## 2024-02-04 DIAGNOSIS — R918 Other nonspecific abnormal finding of lung field: Secondary | ICD-10-CM | POA: Insufficient documentation

## 2024-02-04 DIAGNOSIS — G4700 Insomnia, unspecified: Secondary | ICD-10-CM | POA: Insufficient documentation

## 2024-02-04 DIAGNOSIS — M549 Dorsalgia, unspecified: Secondary | ICD-10-CM | POA: Insufficient documentation

## 2024-02-04 DIAGNOSIS — R0683 Snoring: Secondary | ICD-10-CM | POA: Insufficient documentation

## 2024-02-04 DIAGNOSIS — M1711 Unilateral primary osteoarthritis, right knee: Principal | ICD-10-CM | POA: Insufficient documentation

## 2024-02-04 DIAGNOSIS — R0600 Dyspnea, unspecified: Secondary | ICD-10-CM | POA: Insufficient documentation

## 2024-02-04 DIAGNOSIS — M1990 Unspecified osteoarthritis, unspecified site: Secondary | ICD-10-CM | POA: Diagnosis present

## 2024-02-04 DIAGNOSIS — Z96651 Presence of right artificial knee joint: Principal | ICD-10-CM

## 2024-02-04 DIAGNOSIS — G8929 Other chronic pain: Secondary | ICD-10-CM | POA: Insufficient documentation

## 2024-02-04 HISTORY — DX: Dependence on supplemental oxygen: Z99.81

## 2024-02-04 LAB — PARACHUTE DME

## 2024-02-04 MED ORDER — DEXTROSE 5% IN WATER (D5W) FLUSH BAG - 250 ML: INTRAVENOUS | Status: DC | PRN

## 2024-02-04 MED ORDER — MIDAZOLAM 5 MG/ML INJECTION WRAPPER
Freq: Once | INTRAMUSCULAR | Status: DC | PRN
  Administered 2024-02-04 (×2): 2.5 mg via INTRAVENOUS

## 2024-02-04 MED ORDER — HYDROMORPHONE (PF) 2 MG/ML INJECTION SOLUTION: 0.2000 mg | INTRAMUSCULAR | Status: DC | PRN

## 2024-02-04 MED ORDER — ROPINIROLE 1 MG TABLET
3.0000 mg | ORAL_TABLET | Freq: Every evening | ORAL | Status: DC
  Administered 2024-02-04: 3 mg via ORAL
  Filled 2024-02-04: qty 3

## 2024-02-04 MED ORDER — SODIUM CHLORIDE 0.9 % INTRAVENOUS SOLUTION
2.0000 g | Freq: Once | INTRAVENOUS | Status: AC
  Administered 2024-02-04: 2 g via INTRAVENOUS

## 2024-02-04 MED ORDER — FENTANYL (PF) 50 MCG/ML INJECTION WRAPPER: 25.0000 ug | INJECTION | INTRAMUSCULAR | Status: DC | PRN

## 2024-02-04 MED ORDER — IPRATROPIUM 0.5 MG-ALBUTEROL 3 MG (2.5 MG BASE)/3 ML NEBULIZATION SOLN: 3.0000 mL | INHALATION_SOLUTION | Freq: Once | RESPIRATORY_TRACT | Status: DC | PRN

## 2024-02-04 MED ORDER — DEXAMETHASONE SODIUM PHOSPHATE (PF) 10 MG/ML INJECTION SOLUTION
INTRAMUSCULAR | Status: AC
  Filled 2024-02-04: qty 1

## 2024-02-04 MED ORDER — SODIUM CHLORIDE 0.9% FLUSH BAG - 250 ML: INTRAVENOUS | Status: DC | PRN

## 2024-02-04 MED ORDER — DEXAMETHASONE SODIUM PHOSPHATE (PF) 10 MG/ML INJECTION SOLUTION
10.0000 mg | Freq: Once | INTRAMUSCULAR | Status: AC
  Administered 2024-02-04: 10 mg via INTRAVENOUS

## 2024-02-04 MED ORDER — ASPIRIN 81 MG CHEWABLE TABLET
81.0000 mg | CHEWABLE_TABLET | Freq: Two times a day (BID) | ORAL | Status: DC
  Administered 2024-02-05: 81 mg via ORAL
  Filled 2024-02-04: qty 1

## 2024-02-04 MED ORDER — CELECOXIB 100 MG CAPSULE
200.0000 mg | ORAL_CAPSULE | Freq: Once | ORAL | Status: AC
  Administered 2024-02-04: 200 mg via ORAL

## 2024-02-04 MED ORDER — EPHEDRINE SULFATE 50 MG/ML INTRAVENOUS SOLUTION
Freq: Once | INTRAVENOUS | Status: DC | PRN
  Administered 2024-02-04: 25 mg via INTRAVENOUS
  Administered 2024-02-04: 15 mg via INTRAVENOUS
  Administered 2024-02-04 (×2): 10 mg via INTRAVENOUS

## 2024-02-04 MED ORDER — OXYCODONE ER 10 MG TABLET,CRUSH RESISTANT,EXTENDED RELEASE 12 HR
20.0000 mg | EXTENDED_RELEASE_ORAL_TABLET | Freq: Once | ORAL | Status: AC
  Administered 2024-02-04: 20 mg via ORAL

## 2024-02-04 MED ORDER — NALOXONE 0.4 MG/ML INJECTION SOLUTION: 0.4000 mg | INTRAMUSCULAR | Status: DC | PRN

## 2024-02-04 MED ORDER — ALBUTEROL SULFATE 2.5 MG/3 ML (0.083 %) SOLUTION FOR NEBULIZATION: 2.5000 mg | INHALATION_SOLUTION | Freq: Once | RESPIRATORY_TRACT | Status: DC | PRN

## 2024-02-04 MED ORDER — ROPIVACAINE (PF) 2 MG/ML (0.2 %) INJECTION SOLUTION
Freq: Once | INTRAMUSCULAR | Status: DC | PRN
  Administered 2024-02-04: 30 mL via INTRA_ARTICULAR

## 2024-02-04 MED ORDER — MULTIVITAMIN WITH FOLIC ACID 400 MCG TABLET
1.0000 | ORAL_TABLET | Freq: Every day | ORAL | Status: DC
  Administered 2024-02-04 – 2024-02-05 (×2): 1 via ORAL
  Filled 2024-02-04 (×2): qty 1

## 2024-02-04 MED ORDER — VERAPAMIL 80 MG TABLET
80.0000 mg | ORAL_TABLET | Freq: Two times a day (BID) | ORAL | Status: DC
  Administered 2024-02-04 – 2024-02-05 (×2): 80 mg via ORAL
  Filled 2024-02-04 (×2): qty 1

## 2024-02-04 MED ORDER — KETOROLAC 30 MG/ML (1 ML) INJECTION SOLUTION
15.0000 mg | Freq: Three times a day (TID) | INTRAMUSCULAR | Status: DC
  Administered 2024-02-04 – 2024-02-05 (×3): 15 mg via INTRAVENOUS
  Filled 2024-02-04 (×3): qty 1

## 2024-02-04 MED ORDER — OXYCODONE ER 10 MG TABLET,CRUSH RESISTANT,EXTENDED RELEASE 12 HR
EXTENDED_RELEASE_ORAL_TABLET | ORAL | Status: AC
  Filled 2024-02-04: qty 2

## 2024-02-04 MED ORDER — DOCUSATE SODIUM 100 MG CAPSULE
100.0000 mg | ORAL_CAPSULE | Freq: Two times a day (BID) | ORAL | Status: DC
  Administered 2024-02-04 – 2024-02-05 (×3): 100 mg via ORAL
  Filled 2024-02-04 (×3): qty 1

## 2024-02-04 MED ORDER — BUPIVACAINE (PF) 0.75 % (7.5 MG/ML) IN 8.25 % DEXTROSE INJECTION
Freq: Once | INTRAMUSCULAR | Status: AC | PRN
  Administered 2024-02-04: 1.8 mL via INTRATHECAL

## 2024-02-04 MED ORDER — TRAMADOL 50 MG TABLET
50.0000 mg | ORAL_TABLET | Freq: Four times a day (QID) | ORAL | Status: DC
  Administered 2024-02-04 – 2024-02-05 (×5): 50 mg via ORAL
  Filled 2024-02-04 (×5): qty 1

## 2024-02-04 MED ORDER — DEXAMETHASONE SODIUM PHOSPHATE (PF) 10 MG/ML INJECTION SOLUTION
8.0000 mg | Freq: Two times a day (BID) | INTRAMUSCULAR | Status: AC
  Administered 2024-02-04 – 2024-02-05 (×2): 8 mg via INTRAVENOUS
  Filled 2024-02-04 (×2): qty 1

## 2024-02-04 MED ORDER — GLYCOPYRROLATE 0.2 MG/ML INJECTION SOLUTION
Freq: Once | INTRAMUSCULAR | Status: DC | PRN
  Administered 2024-02-04 (×2): .2 mg via INTRAVENOUS

## 2024-02-04 MED ORDER — ONDANSETRON HCL (PF) 4 MG/2 ML INJECTION SOLUTION
8.0000 mg | Freq: Once | INTRAMUSCULAR | Status: AC
  Administered 2024-02-04: 8 mg via INTRAVENOUS

## 2024-02-04 MED ORDER — MORPHINE 2 MG/ML INJECTION WRAPPER: 2.0000 mg | INJECTION | INTRAMUSCULAR | Status: DC | PRN

## 2024-02-04 MED ORDER — APREPITANT 40 MG CAPSULE
ORAL_CAPSULE | ORAL | Status: AC
  Filled 2024-02-04: qty 1

## 2024-02-04 MED ORDER — BISACODYL 10 MG RECTAL SUPPOSITORY: 10.0000 mg | Freq: Every day | RECTAL | Status: DC | PRN

## 2024-02-04 MED ORDER — CHOLECALCIFEROL (VITAMIN D3) 25 MCG (1,000 UNIT) TABLET
1000.0000 [IU] | ORAL_TABLET | Freq: Every day | ORAL | Status: DC
  Administered 2024-02-04 – 2024-02-05 (×2): 1000 [IU] via ORAL
  Filled 2024-02-04 (×2): qty 1

## 2024-02-04 MED ORDER — FENTANYL (PF) 50 MCG/ML INJECTION WRAPPER
INJECTION | Freq: Once | INTRAMUSCULAR | Status: DC | PRN
  Administered 2024-02-04 (×4): 20 ug via INTRAVENOUS

## 2024-02-04 MED ORDER — BUPROPION HCL SR 150 MG TABLET,12 HR SUSTAINED-RELEASE
150.0000 mg | ORAL_TABLET | Freq: Every day | ORAL | Status: DC
  Administered 2024-02-04 – 2024-02-05 (×2): 150 mg via ORAL
  Filled 2024-02-04 (×2): qty 1

## 2024-02-04 MED ORDER — PROCHLORPERAZINE EDISYLATE 10 MG/2 ML (5 MG/ML) INJECTION SOLUTION: 5.0000 mg | Freq: Once | INTRAMUSCULAR | Status: DC | PRN

## 2024-02-04 MED ORDER — MIDAZOLAM 5 MG/ML INJECTION WRAPPER
INTRAMUSCULAR | Status: AC
  Filled 2024-02-04: qty 1

## 2024-02-04 MED ORDER — SODIUM CHLORIDE 0.9 % (FLUSH) INJECTION SYRINGE: 3.0000 mL | INJECTION | INTRAMUSCULAR | Status: DC | PRN

## 2024-02-04 MED ORDER — PHENYLEPHRINE 10 MG/ML INJECTION SOLUTION
Freq: Once | INTRAMUSCULAR | Status: DC | PRN
  Administered 2024-02-04: 200 ug via INTRAVENOUS

## 2024-02-04 MED ORDER — VENLAFAXINE ER 75 MG CAPSULE,EXTENDED RELEASE 24 HR
75.0000 mg | ORAL_CAPSULE | Freq: Every day | ORAL | Status: DC
  Administered 2024-02-04 – 2024-02-05 (×2): 75 mg via ORAL
  Filled 2024-02-04 (×2): qty 1

## 2024-02-04 MED ORDER — OXYCODONE-ACETAMINOPHEN 5 MG-325 MG TABLET: 1.0000 | ORAL_TABLET | ORAL | Status: DC | PRN

## 2024-02-04 MED ORDER — FAMOTIDINE (PF) 20 MG/2 ML INTRAVENOUS SOLUTION
20.0000 mg | Freq: Once | INTRAVENOUS | Status: AC
  Administered 2024-02-04: 20 mg via INTRAVENOUS

## 2024-02-04 MED ORDER — LACTATED RINGERS INTRAVENOUS SOLUTION: INTRAVENOUS | Status: DC

## 2024-02-04 MED ORDER — ETHYL ALCOHOL 62 % TOPICAL SWAB
1.0000 | Freq: Once | CUTANEOUS | Status: AC
  Administered 2024-02-04: 1 via NASAL

## 2024-02-04 MED ORDER — ONDANSETRON HCL (PF) 4 MG/2 ML INJECTION SOLUTION
INTRAMUSCULAR | Status: AC
  Filled 2024-02-04: qty 4

## 2024-02-04 MED ORDER — GABAPENTIN 300 MG CAPSULE
300.0000 mg | ORAL_CAPSULE | Freq: Once | ORAL | Status: AC
  Administered 2024-02-04: 300 mg via ORAL

## 2024-02-04 MED ORDER — PROPOFOL 10 MG/ML INTRAVENOUS EMULSION
INTRAVENOUS | Status: DC | PRN
  Administered 2024-02-04 (×2): 100 ug/kg/min via INTRAVENOUS
  Administered 2024-02-04: 0 ug/kg/min via INTRAVENOUS
  Administered 2024-02-04: 120 ug/kg/min via INTRAVENOUS

## 2024-02-04 MED ORDER — TRANEXAMIC ACID 1,000 MG/10 ML (100 MG/ML) INTRAVENOUS SOLUTION
INTRAVENOUS | Status: AC
  Filled 2024-02-04: qty 10

## 2024-02-04 MED ORDER — ONDANSETRON HCL (PF) 4 MG/2 ML INJECTION SOLUTION: 4.0000 mg | INTRAMUSCULAR | Status: DC | PRN

## 2024-02-04 MED ORDER — ACETAMINOPHEN 325 MG TABLET
ORAL_TABLET | ORAL | Status: AC
  Filled 2024-02-04: qty 3

## 2024-02-04 MED ORDER — ONDANSETRON HCL (PF) 4 MG/2 ML INJECTION SOLUTION: 4.0000 mg | Freq: Once | INTRAMUSCULAR | Status: DC | PRN

## 2024-02-04 MED ORDER — ACETAMINOPHEN 325 MG TABLET
975.0000 mg | ORAL_TABLET | Freq: Once | ORAL | Status: AC
  Administered 2024-02-04: 975 mg via ORAL

## 2024-02-04 MED ORDER — ROPIVACAINE (PF) 2 MG/ML (0.2 %) INJECTION SOLUTION
INTRAMUSCULAR | Status: AC
  Filled 2024-02-04: qty 30

## 2024-02-04 MED ORDER — FENTANYL (PF) 50 MCG/ML INJECTION SOLUTION
INTRAMUSCULAR | Status: AC
  Filled 2024-02-04: qty 2

## 2024-02-04 MED ORDER — TRANEXAMIC ACID 1,000 MG/10 ML (100 MG/ML) INTRAVENOUS SOLUTION
1000.0000 mg | Freq: Once | INTRAVENOUS | Status: AC
  Administered 2024-02-04: 1000 mg via INTRAVENOUS

## 2024-02-04 MED ORDER — SODIUM CHLORIDE 0.9 % INTRAVENOUS SOLUTION
INTRAVENOUS | Status: AC
  Administered 2024-02-04: 0 mL via INTRAVENOUS

## 2024-02-04 MED ORDER — CEFAZOLIN 2 GRAM INTRAVENOUS SOLUTION
INTRAVENOUS | Status: AC
  Filled 2024-02-04: qty 14.71

## 2024-02-04 MED ORDER — GABAPENTIN 300 MG CAPSULE
ORAL_CAPSULE | ORAL | Status: AC
  Filled 2024-02-04: qty 1

## 2024-02-04 MED ORDER — CELECOXIB 100 MG CAPSULE
ORAL_CAPSULE | ORAL | Status: AC
  Filled 2024-02-04: qty 2

## 2024-02-04 MED ORDER — FENTANYL (PF) 50 MCG/ML INJECTION WRAPPER
INJECTION | Freq: Once | INTRAMUSCULAR | Status: AC | PRN
  Administered 2024-02-04: 20 ug via INTRATHECAL

## 2024-02-04 MED ORDER — FAMOTIDINE 20 MG TABLET
20.0000 mg | ORAL_TABLET | Freq: Two times a day (BID) | ORAL | Status: DC
  Administered 2024-02-04 – 2024-02-05 (×3): 20 mg via ORAL
  Filled 2024-02-04 (×3): qty 1

## 2024-02-04 MED ORDER — FAMOTIDINE (PF) 20 MG/2 ML INTRAVENOUS SOLUTION
INTRAVENOUS | Status: AC
  Filled 2024-02-04: qty 2

## 2024-02-04 MED ORDER — FENTANYL (PF) 50 MCG/ML INJECTION WRAPPER: 50.0000 ug | INJECTION | INTRAMUSCULAR | Status: DC | PRN

## 2024-02-04 MED ORDER — OXYCODONE-ACETAMINOPHEN 5 MG-325 MG TABLET
2.0000 | ORAL_TABLET | ORAL | Status: DC | PRN
  Administered 2024-02-04 – 2024-02-05 (×3): 2 via ORAL
  Filled 2024-02-04 (×3): qty 2

## 2024-02-04 MED ORDER — ACETAMINOPHEN 325 MG TABLET: 650.0000 mg | ORAL_TABLET | ORAL | Status: DC | PRN

## 2024-02-04 MED ORDER — LACTATED RINGERS INTRAVENOUS SOLUTION
INTRAVENOUS | Status: DC
  Administered 2024-02-04: 0 mL via INTRAVENOUS

## 2024-02-04 MED ORDER — SODIUM CHLORIDE 0.9 % (FLUSH) INJECTION SYRINGE
3.0000 mL | INJECTION | Freq: Three times a day (TID) | INTRAMUSCULAR | Status: DC
  Administered 2024-02-04: 0 mL
  Administered 2024-02-04 (×2): 3 mL
  Administered 2024-02-05: 0 mL

## 2024-02-04 MED ORDER — ETHYL ALCOHOL 62 % TOPICAL SWAB
1.0000 | Freq: Two times a day (BID) | CUTANEOUS | Status: DC
  Administered 2024-02-04 – 2024-02-05 (×2): 1 via NASAL

## 2024-02-04 MED ORDER — SODIUM CHLORIDE 0.9 % INTRAVENOUS SOLUTION
INTRAVENOUS | Status: AC
  Filled 2024-02-04: qty 100

## 2024-02-04 MED ORDER — APREPITANT 40 MG CAPSULE
40.0000 mg | ORAL_CAPSULE | Freq: Once | ORAL | Status: AC
  Administered 2024-02-04: 40 mg via ORAL

## 2024-02-04 NOTE — Addendum Note (Signed)
 Addendum  created 02/04/24 1127 by Naomi Pines, APRN,CRNA    Flowsheet accepted, Intraprocedure Event deleted, Intraprocedure Event edited, Intraprocedure Meds edited

## 2024-02-04 NOTE — Anesthesia Transfer of Care (Signed)
 ANESTHESIA TRANSFER OF CARE   Alec Friedman is a 54 y.o. ,male, Weight: 95.3 kg (210 lb)   had Procedure(s):  MAKO ROBOTIC ASSISTED RIGHT TOTAL KNEE ARTHROPLASTY USING TRIATHLON IMPLANTS  performed  02/04/2024   Primary Service: Gilmore Search, DO    Past Medical History:   Diagnosis Date    Back pain     Chronic pain     Kidney stones     MI (myocardial infarction)     Muscle weakness     Neck problem     steel plate    Nerve damage     neck, spine, legs    Oxygen dependent     2 liters as needed    Vitamin B 12 deficiency       Allergy History as of 02/04/24        No Known Allergies                  I completed my transfer of care / handoff to the receiving personnel during which we discussed:  Access, Airway, All key/critical aspects of case discussed, Analgesia, Antibiotics, Expectation of post procedure, Fluids/Product, Gave opportunity for questions and acknowledgement of understanding, Labs and PMHx      Post Location: PACU                                                             Last OR Temp: Temperature: 36.3 C (97.4 F)      Airway:* No LDAs found *  Blood pressure 112/68, pulse 76, temperature 36.3 C (97.4 F), resp. rate 18, height 1.753 m (5' 9), weight 95.3 kg (210 lb), SpO2 96%.

## 2024-02-04 NOTE — Anesthesia Postprocedure Evaluation (Signed)
 Anesthesia Post Op Evaluation    Patient: Alec Friedman  Procedure(s):  MAKO ROBOTIC ASSISTED RIGHT TOTAL KNEE ARTHROPLASTY USING TRIATHLON IMPLANTS    Last Vitals:Temperature: 36.2 C (97.2 F) (02/04/24 1037)  Heart Rate: 78 (02/04/24 1037)  BP (Non-Invasive): (!) 138/93 (02/04/24 1037)  Respiratory Rate: 12 (02/04/24 1037)  SpO2: 99 % (02/04/24 1037)    No notable events documented.    Patient is sufficiently recovered from the effects of anesthesia to participate in the evaluation and has returned to their pre-procedure level.  Patient location during evaluation: PACU       Patient participation: complete - patient participated  Level of consciousness: awake and alert and responsive to verbal stimuli    Pain management: adequate  Airway patency: patent    Anesthetic complications: no  Cardiovascular status: acceptable  Respiratory status: acceptable  Hydration status: acceptable  Patient post-procedure temperature: Pt Normothermic   PONV Status: Absent

## 2024-02-04 NOTE — Interval H&P Note (Signed)
 H & P updated the day of the procedure.  1.  H&P completed within 30 days of surgical procedure and has been reviewed within 24 hours of admission but prior to surgery or a procedure requiring anesthesia services, the patient has been examined, and no change has occured in the patients condition since the H&P was completed.       Change in medications: No        No LMP recorded.      Comments:     2.  Patient continues to be appropriate candidate for planned surgical procedure. YES    Quenton Fetter, DO

## 2024-02-04 NOTE — OR Surgeon (Signed)
 [place] MEDICINE [facility]  Operative Note     PATIENT NAME:  Alec, Friedman  MRN:  [id number]  DOB:  09-Feb-1970    Date of Procedure:  02/04/2024  Preoperative Diagnosis: Osteoarthritis of right knee, unspecified osteoarthritis type [M17.11]   Postoperative Diagnoses:  Osteoarthritis of right knee   Procedure Performed: ProcProcedure(s) (LRB):  MAKO ROBOTIC ASSISTED RIGHT TOTAL KNEE ARTHROPLASTY USING TRIATHLON IMPLANTS (Right)   Implants:  Implant Name Type Inv. Item Serial No. Manufacturer Lot No. LRB No. Used Action   FEM 5 PA CRCTE RTN BEAD TRIATH KNEE RGT COM STRL LF - [id number]  FEM 5 PA CRCTE RTN BEAD TRIATH KNEE RGT COM STRL LF  [employer] [id number] Right 1 Implanted   PATEL  TRIT METAL ASYM TRIATH KNEE COM STRL - [id number]  PATEL  TRIT METAL ASYM TRIATH KNEE COM STRL  [employer] [id number] Right 1 Implanted   INSERT TIB BRNG CONDYLAR STAB_5 TRIATH KNEE STRL - [id number]  INSERT TIB BRNG CONDYLAR STAB_5 TRIATH KNEE STRL  [employer] [id number] Right 1 Implanted   BASEPLATE TIB TRIATH 5 KNEE TRIT - [id number]  BASEPLATE TIB TRIATH 5 KNEE BERN[employer] [id number] Right 1 Implanted      [employer]    Surgeon: Gilmore Search, DO   Anesthesia:   Spinal  Anesthesiologist: Charlott Agent, MD  CRNA: Naomi Pines, APRN,CRNA  OR Staff: Circulator: Gerome Mora, RN; Gerlean Fallow, RN  PERIOPERATIVE CARE ASSISTANT: Nyle Elspeth JONETTA Mickey., PCA  Scrub Person: Ethyl Ditch, RN  Scrub First Assist: Ira Fuller, CST   First Assist: Fuller Ira  FA Function: Physician placed the retractor and FA held retractors on soft tissue, 2nd layer closure  Anticoagulation: ASA   Estimated Blood Loss: * No blood loss amount entered *   Specimens: * No specimens in log *   Complications: None   Indications For Procedure:  Alec Friedman  is a very pleasant  54 y.o. male  presenting  for Osteoarthritis of right knee, unspecified  osteoarthritis type [M17.11]   End stage arthritis interfering with ADL and failing multiple non-surgical treatments.  Discussed robotic technology, thromoboembolism, infection and risks, benefits, indications, and complications of procedure. Informed signed consent obtained.    DESCRIPTION OF THE PROCEDURE:  Patient identified and time out completed with entire surgical team.  Antibiotics and tranexamic acid  administered prior to inflation of torniquet.  Satisfactory anesthesia obtained. Tourniquet was placed over padding on the thigh.   The leg was prepped with DuraPrep and draped in a sterile manner.  Elevation exsanguination, tourniquet to 350 mmHg.    Anterior approach to the knee through medial parapatellar arthrotomy, the knee exposed.  We placed the femoral array inside the incision.  Tibial array outside. The femoral and tibial markers were placed and we successfully registered the knee. The existing components were removed and robotic plan for bone cuts and balancing completed in flexion and extension.  The robot was then utilized to perform all cuts.  Posterior osteophytes were removed from the femur.   Patella was resected at the reflection of the patellar ligament.   Trials were positioned with excellent extension and flexion with good rotation, alignment and stability verified robotically. The patellar component was implanted and the component was verified to be well fixed.  Patellar tracking was evaluated and found to be satisfactory. Knee was copiously irrigated with pulsatile lavage, then one-minute soak with Irrisept solution.  The tibial baseplate impacted into position.  The knee then taken through range of motion with full extension and flexion with good rotation, alignment and stability, balance and ROM again verified with MAKO robotics.    Wound was irrigated.  The arthrotomy was closed in interrupted fashion.  The wound was closed in layers finishing with subcuticular stitch on the skin.   Sterile bandage was applied, and the tourniquet was released.  The patient was awoken from anesthesia and taken to recovery room under the supervision of anesthesia.  Gilmore Search, DO

## 2024-02-04 NOTE — Anesthesia Procedure Notes (Signed)
 Alec Friedman    Neuraxial Block    Performed by:   Performing Provider: Naomi Pines, APRN,CRNA   Authorizing provider: Charlott Agent, MD      Sedation        Blocks   Block: spinal   Type of block: single shot   Indication:primary anesthetic         Technique:  Pt location: In OR  Approach: midline          Needle level: L4-5  Skin Prep: Sterilely prepped and draped, Sterile technique, Mask, Cap, Sterile gloves, Hand hygiene performed and Sterile field established   Preanesthesia Checklist:  Pre anesthesia checklist: site marked, surgical consent, monitors and equipment checked, timeout performed, anesthesia consent, emergency drugs available and positioning concerns  Position:  Positioning: sitting   Skin Local:  Skin local: Lidocaine  1%   Spinal Needle:  Spinal needle:Pencan    Spinal Needle gauge: 25 G    Spinal needle length: 3.5 inch Spinal needle attempts: 1.  Epidural Needle:                Epidural Catheter:              Block Events:     Test dose:                Medications    bupivacaine  PF 0.75%-dextrose  8.25% (MARCAINE  SPINAL) spinal injection - Intrathecal   1.8 mL - 02/04/2024 8:50:00 AM  fentaNYL  (SUBLIMAZE ) 50 mcg/mL injection - Intrathecal   20 mcg - 02/04/2024 8:56:00 AM  Dosing          NOTES

## 2024-02-04 NOTE — Nurses Notes (Signed)
 Patient transported to 382 at this time and transferred via hovermat. Patient alert and oriented denying any pain or discomfort. Able to wiggle toes and rock legs back and forth. Dentures in place w/ glasses on patient; 1 personal belonging bag left at bedside. Continuous pulse ox monitoring in use. Foot pumps on. Amy Pilkins RN and Rexene Lesches RN at bedside. Side rails up x2 with callbell within reach.    Vital Signs:  Temp:  97.8F  HR:  76  RR:  16  02 sat:  93% on RA  BP:  94/72

## 2024-02-04 NOTE — PT Evaluation (Signed)
 [facility]  [address]  [place], [address]  [phone]  (Fax) [phone]  Rehabilitation Services  Physical Therapy Inpatient TKA Initial Evaluation    Patient Name: Alec Friedman  Date of Birth: May 02, 1970  Height: Height: 175.3 cm (5' 9)  Weight: Weight: 95.3 kg (210 lb)  Room/Bed: 382/A  Payor: [place] MEDICARE / Plan: [place] MEDICARE ADVANTAGE HMO / Product Type: MEDICARE [facility] /       PMH:  Past Medical History:   Diagnosis Date    Back pain     Chronic pain     Kidney stones     MI (myocardial infarction)     Muscle weakness     Neck problem     steel plate    Nerve damage     neck, spine, legs    Oxygen dependent     2 liters as needed    Vitamin B 12 deficiency            Assessment:      Patient was admitted for an elective right TKA and has done well postoperatively. He participated well with PT evaluation. he completed bed mobility independently for scooting, CGA for supine to EOB, SBA for EOB to supine, sit to/from stand with CGA, stand to sit with SBA and ambulated 190 feet with FWW and CGA/SBA mainly with reciprocal pattern. He will benefit from continued PT services for further training and education following TKA protocol.    Discharge Needs:    Equipment Recommendation: front wheeled walker      The patient presents with mobility limitations due to impaired range of motion, impaired strength, weight bearing restrictions, impaired functional activity tolerance, and pain that significantly impair/prevent patients ability to participate in mobility-related activities of daily living (MRADLs) including  ambulation and transfers in order to safely complete, toileting, bathing, food preparation, laundering/household tasks, safely entering/exiting the home, driving, community access and farm duties. This functional mobility deficit can be improved with the use of a front wheeled walker  in order to decrease the risk of falls in performance of these MRADLs.  Patient  is able to safely use this assistive device.    Discharge Disposition: home with assist, home with outpatient services    JUSTIFICATION OF DISCHARGE RECOMMENDATION   Based on current diagnosis, functional performance prior to admission, and current functional performance, this patient requires continued PT services in home with assist, home with outpatient services in order to achieve significant functional improvements in these deficit areas: gait, locomotion, and balance, muscle performance, ROM (range of motion).        Plan:   Current Intervention: bed mobility training, gait training, home exercise program, patient/family education, ROM (range of motion), stair training, strengthening, stretching, transfer training  To provide physical therapy services other (see comments) (1-3x/day Monday through Saturday)  for duration of until discharge.    The risks/benefits of therapy have been discussed with the patient/caregiver and he/she is in agreement with the established plan of care.       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  Order received for PT evaluation to begin on DOS.   Medical Lines PIV Line   Respiratory Status room air   Existing Precautions/Restrictions weight bearing restriction;fall precautions  (WBAT RLE)   Mutuality/Individual Preferences   Anxieties, Fears or Concerns Pain, overall recovery   Individualized Care Needs Physical Therapy, Pain Management   Living Environment   Lives With alone  (will have friends and a neighbor to assist him as needed and provide transportation)   Living Arrangements house  (one story)   Home Accessibility stairs to enter home   Home Main Entrance   Number of Stairs, Main Entrance one   Stair Railings, Main Entrance none   Functional Level Prior   Ambulation 1 - assistive equipment  (SPC most of the time and sometimes bilateral axillary crutches in the AM when knee was stiff or extra painful)   Transferring 0 - independent   Toileting 0 - independent   Bathing 0 - independent   Dressing 0 - independent   Eating 0 - independent   Communication 0 - understands/communicates without difficulty   Prior Functional Level Comment Fully independent PLOF, drives and accesses community. 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   Pre Treatment Status   Pre Treatment Patient Status Patient supine in bed;Call light within reach;Patient safety alarm activated   Support Present Pre Treatment  None   Communication Pre Treatment  Nurse   Communication Pre Treatment Comment cleared for PT evaluation   Cognitive Assessment/Interventions   Behavior/Mood Observations behavior appropriate to situation, WNL/WFL;alert;cooperative   Orientation Status oriented x 4   Attention WNL/WFL   Follows Commands WNL   Pre- Treatment Vital Signs   Pre SpO2 (%) 93   O2 Delivery Pre Treatment room air   Pre-Treatment Pain   Pretreatment Pain Rating 5/10   Pre/Posttreatment Pain Comment right knee, stated no increase in pain with WB and gait   RUE Assessment   RUE Assessment WFL- Within Functional Limits   LUE  Assessment   LUE Assessment WFL- Within Functional Limits   RLE Assessment   RLE Assessment X-Exceptions   RLE ROM Knee  (WFL hip and ankle)   RLE Strength 3-/5, hip 4/5 and ankle 5/5   Right Knee Flexion unable to accurately assess due to Jones dressing   Right Knee Extensor Lag unable to accurately assess due to Jones dressing   LLE Assessment   LLE Assessment WFL- Within Functional Limits   Trunk Assessment   Trunk Assessment WFL-Within Functional Limits   Mobility Assessment/Training   Additional  Documentation Bed Mobility Assessment/Treatment (Group);Transfer Assessment/Treatment (Group);Gait Assessment/Treatment (Group)   Bed Mobility   Scoot/Bridge Independence stand-by assistance   Supine-Sit Independence contact guard assist   Sit to Supine, Independence stand-by assistance  (used LLE to support RLE while bringing legs up onto bed)   Bed Mobility, Assistive Device bed rails   Comment While sitting at EOB, patient able to don underwear over his feet by reaching with SBA.   Transfer Assessment/Treatment   Sit-Stand Independence contact guard assist   Stand-Sit Independence stand-by assistance   Sit-Stand-Sit, Assist Device walker, front wheeled   Transfer Comment Was able upon standing, after getting balanced, to pull up his underwear without holding to walker and no LOB.  Min cues for hand placement during transfer for safety.   Gait Assessment/Treatment   Total Distance Ambulated 190   Independence  contact guard assist;stand-by assistance   Assistive Device  walker, front wheeled   Distance in Feet 190 feet   Comment initiated with 2 point step to pattern and quickly transitioned to reciprocal pattern on his own.   Post Treatment Status   Post Treatment Patient Status Patient supine in bed;Call light within reach;Patient safety alarm activated   Support Present Post Treatment  None   Communication Post Treatement Nurse   Communication Post Treatment Comment updated on status of eval   Patient Effort  excellent   Post-Treatment Vital Signs   Post SpO2 (%) 95   O2 Delivery Post Treatment room air   Post-Treatment Pain   Posttreatment Pain Rating 5/10   Physical Therapy Clinical Impression   Assessment Patient was admitted for an elective right TKA and has done well postoperatively. He participated well with PT evaluation. he completed bed mobility independently for scooting, CGA for supine to EOB, SBA for EOB to supine, sit to/from stand with CGA, stand to sit with SBA and ambulated 190 feet with FWW and CGA/SBA mainly with reciprocal pattern. He will benefit from continued PT services for further training and education following TKA protocol.   Criteria for Skilled Therapeutic meets criteria   Impairments Found (describe specific impairments) gait, locomotion, and balance;muscle performance;ROM (range of motion)   Functional Limitations in Following  self-care;home management;community/leisure;other (see comments)  (farm duties)   Rehab Potential good   Therapy Frequency other (see comments)  (1-3x/day Monday through Saturday)   Predicted Duration of Therapy Intervention (days/wks) until discharge   Anticipated Equipment Needs at Discharge (PT) front wheeled walker   Anticipated Discharge Disposition home with assist;home with outpatient services   Evaluation Complexity Justification   Patient History: Co-morbidity/factors that impact Plan of Care 1-2 that impact Plan of Care   Examination Components 3 or more Exam elements addressed   Presentation Stable: Uncomplicated, straight-forward, problem focused   Clinical Decision Making Low complexity   Evaluation Complexity Low complexity   Planned Therapy Interventions, PT Eval   Planned Therapy Interventions (PT) bed mobility training;gait training;home exercise program;patient/family education;ROM (range of motion);stair training;strengthening;stretching;transfer training   Physical Therapy Time and Intention   Total PT Minutes: 38       TOTAL KNEE ARTHROPLASTY  (TKA) PHYSICAL THERAPY GOALS    1) AMBULATE 300 FEET WITH WALKER AND SBA/S.   2) TRANSFER BED TO CHAIR WITH SBA/S.   3) INCREASED STRENGTH TO 3+/5 KNEE EXTENSION.   4) INCREASED KNEE AAROM TO 4 TO 90 DEGREES.  5) NEGOTIATES TRAINING STEPS USING HANDRAIL AND SBA/S.       Physical Therapy Inpatient TKA D/C Criteria  PATIENT/CAREGIVER EDUCATION  Educated on exercise program? NO  Can successfully demonstrate exercise form? NO  Can demonstrate safe/effective assistance with functional mobility? YES     IMPAIRMENT BASED CRITERIA  Does the patient demonstrate knee ROM of at least 8-75 degrees on the involved leg? COMMENT unable to assess due to Jones dressing  Does the patient demonstrate a minimum of 3+/5 quadricep strength? NO  Does the patient demonstrate the ability to maintain any weight bearing precautions? YES  Does the patient demonstrated sensation of all LE dermatomes? YES  Does the patient have adequate pain control of <4/10 for functional mobility at home? NO    FUNCTIONAL BASED CRITERIA  Does that patient demonstrate the ability to ambulate 80 feet with RW using CGA/SPV assistance? YES  Does the patient demonstrate the ability to perform bed mobility with no more than min assist? YES  Does the patient demonstrated the ability to sit to stand from the bed or chair with min assist? YES  Does the patient demonstrate the ability to walk up/down 3-5 stairs with min assist as required for home entry? NO    ROM  Extension deferred  Flexion deferred        INTERVENTION MINUTES: EVALUATION 24 minutes and GAIT TRAINING 14 MINUTES    EVALUATION COMPLEXITY : CLINICAL DECISION MAKING OF LOW COMPLEXITY AS INDICATED BY PMH, PHYSICAL THERAPY ASSESSMENT OF MUSCULOSKELETAL AND NEUROLOGICAL SYSTEMS AND ACTIVITY LIMITATIONS. CLINICAL PRESENTATION IS STABLE AND UNCOMPLICATED    Therapist:     Landry Rase, PT  02/04/2024, 19:48

## 2024-02-04 NOTE — OR Nursing (Signed)
 Pre-OP Leg Measurements Right Knee  6 in Above Knee:15.5 in  6 in Below Knee: 21.0 in

## 2024-02-04 NOTE — H&P (Signed)
 Paper H and P on chart, will be scanned to EMR.

## 2024-02-05 LAB — BASIC METABOLIC PANEL: GLUCOSE: 154 mg/dL — ABNORMAL HIGH (ref 74–109)

## 2024-02-05 LAB — CBC WITH DIFF
BASOPHIL #: 0.1 10*3/uL (ref 0.00–0.10)
BASOPHIL %: 0 % (ref 0–1)
EOSINOPHIL #: 0 10*3/uL (ref 0.00–0.60)
EOSINOPHIL %: 0 % — ABNORMAL LOW (ref 1–8)
HCT: 35.2 % — ABNORMAL LOW (ref 36.7–47.1)
LYMPHOCYTE #: 0.7 10*3/uL — ABNORMAL LOW (ref 1.00–3.00)
LYMPHOCYTE %: 4 % — ABNORMAL LOW (ref 15–43)
MONOCYTE %: 3 % — ABNORMAL LOW (ref 6–14)
NEUTROPHIL #: 13.9 10*3/uL — ABNORMAL HIGH (ref 1.85–7.84)
NEUTROPHIL %: 92 % — ABNORMAL HIGH (ref 44–74)
RDW: 13.4 % (ref 12.1–16.2)
WBC: 15.1 10*3/uL — ABNORMAL HIGH (ref 3.6–10.2)

## 2024-02-05 LAB — SCAN DIFFERENTIAL
PLATELET MORPHOLOGY COMMENT: NORMAL
RBC MORPHOLOGY COMMENT: NORMAL
SCHISTOCYTES: ABSENT

## 2024-02-05 MED ORDER — ASPIRIN 81 MG TABLET,DELAYED RELEASE: 81.0000 mg | DELAYED_RELEASE_TABLET | Freq: Two times a day (BID) | ORAL | Status: AC

## 2024-02-05 MED ORDER — HYDROCODONE 7.5 MG-ACETAMINOPHEN 325 MG TABLET: 1.0000 | ORAL_TABLET | ORAL | 0 refills | Status: AC | PRN

## 2024-02-05 NOTE — Care Plan (Signed)
 Shift Summary  Severe right knee pain occurred during the shift and was managed with PRN oxyCODONE -acetaminophen , resulting in sustained low pain scores afterwards.  Dressing and bleeding status were monitored with no changes documented, and neurovascular checks of the lower extremities remained within normal limits.  Incentive spirometry was administered as ordered during the shift.  No documentation was available regarding urinary elimination.  Overall, pain control was achieved and no bleeding complications were documented during the shift.    Absence of Bleeding: Dressing was monitored throughout the shift with no changes noted in documentation, and no discoloration was observed in the right lower extremity; capillary refill times remained consistent and within normal limits.    Optimal Pain Control and Function: Pain in the right knee fluctuated, peaking at 7 before oxyCODONE -acetaminophen  was administered, after which pain scores decreased and remained low for the rest of the shift.    Goal Outcome Evaluation:     Anxieties, Fears or Concerns: pain (02/04/24 2030)  Individualized Care Needs: mobilty (02/04/24 2030)  Patient-Specific Goals (Include Timeframe): go home (02/04/24 2030)  Plan of Care Reviewed With: patient (02/04/24 2030)     Patient Progress: improving

## 2024-02-05 NOTE — Care Management Notes (Signed)
 [facility]  Care Management Initial Evaluation    Patient Name: Alec Friedman  Date of Birth: 1970/09/09  Sex: male  Date/Time of Admission: 02/04/2024  6:22 AM  Room/Bed: 382/A  Payor: [place] MEDICARE / Plan: [place] MEDICARE ADVANTAGE HMO / Product Type: MEDICARE [facility] /   Primary Care Providers:  Alton Joen LITTIE, APRN, CNP, APRN,* (General)    Pharmacy Info:   Preferred Pharmacy       [facility] [place] [place] - [address]    [address]  [place] [address]    Phone: [phone] Fax: [phone]    Hours: Not open 24 hours          Emergency Contact Info:   Extended Emergency Contact Information  Primary Emergency Contact: [facility] Phone: [phone]  Relation: Friend  Preferred language: English  Interpreter needed? No    History:   Alec Friedman is a 54 y.o., male, admitted 02/04/24.    Height/Weight: 175.3 cm (5' 9) / 95.3 kg (210 lb)     LOS: 0 days   Admitting Diagnosis: Osteoarthritis of right knee, unspecified osteoarthritis type [M17.11]  Osteoarthritis [M19.90]    Assessment:      02/05/24 1936   Assessment Details   Assessment Type Admission   Date of Care Management Update 02/05/24   Readmission   Is this a readmission? No   Insurance Information/Type   Insurance type Medicare   Employment/Financial   Patient has Prescription Coverage?  Yes        Name of Insurance Coverage for Medications [employer]   Financial/Environmental Concerns none   Living Environment   Select an age group to open lives with row.  Adult   Lives With alone   Living Arrangements house   Able to Return to Prior Arrangements yes   IEP and/or 504 Plan? No   Home Safety   Home Assessment: No Problems Identified   Home Accessibility stairs to enter home   Custody and Legal Status   Do you have a court appointed guardian/conservator? No   Are you an emancipated minor? No   Custody Issues? No   Paternity Affidavit Requested? No   Care Management Plan   Discharge Planning Status initial  meeting   Projected Discharge Date 02/05/24   Discharge plan discussed with: Patient   CM will evaluate for rehabilitation potential yes   Patient choice offered to patient/family Yes   Form for patient choice reviewed/signed and on chart yes   Facility or Agency Preferences [facility]   Discharge Needs Assessment   Outpatient/Agency/Support Group Needs other (see comments)  (Outpatient Physical Therapy)   Equipment Currently Used at Home commode;walker, front wheeled   Equipment Needed After Discharge none   Discharge Facility/Level of Care Needs Home (Patient/Family Member/other)(code 1)   Transportation Available car;family or friend will provide   Discharge Information   Discharge Disposition home or self-care   Referral Information   Admission Type observation   Arrived From home or self-care   Home Main Entrance   Number of Stairs, Main Entrance one     Initial CM assessment completed, Pt admitted with total right knee arthroplasty. CM met with pt at bedside. Pt was alert and oriented to all spheres. Pt lives alone and plans to return there upon discharge. Pt has 1 step to get into their home and then it is all one level inside of their home. Pt will have friends to assist  in her care. Pt has a walker and a bedside commode. Pt would like to have outpatient physical therapy at [facility]. CM faxed referral to [facility] and pt is scheduled for 02/10/24 at 10:00 AM.     Discharge Plan:  Home (Patient/Family Member/other) (code 1)      The patient will continue to be evaluated for developing discharge needs.     Case Manager: Knox Ahle, [place]  Phone: [phone]

## 2024-02-05 NOTE — PT Treatment (Signed)
 [facility]  [address]  [place], [address]  [phone]  (Fax) [phone]  Rehabilitation Department  Physical Therapy Daily Inpatient TKA Note    Date: 02/05/2024  Patient's Name: Alec Friedman  Date of Birth: 08/09/1970  Height: Height: 175.3 cm (5' 9)  Weight: Weight: 95.3 kg (210 lb)      Plan: Will continue under current POC.         Subjective/Objective/Assessment:  Flowsheet    02/05/24 9187   Rehab Session   Document Type therapy progress note (daily note)   PT Visit Date 02/05/24   General Information   Patient Profile Reviewed yes   Medical Lines PIV Line   Respiratory Status room air   Existing Precautions/Restrictions fall precautions   Pre Treatment Status   Pre Treatment Patient Status Patient sitting in bedside chair or w/c   Support Present Pre Treatment  None   Communication Pre Treatment  Charge Nurse   Communication Pre Treatment Comment cleared   Pre-Treatment Pain   Pretreatment Pain Rating 8/10   Pre/Posttreatment Pain Comment right knee   Transfer Assessment/Treatment   Sit-Stand Independence stand-by assistance   Stand-Sit Independence stand-by assistance   Sit-Stand-Sit, Assist Device walker, front wheeled   Transfer Impairments endurance;pain;strength decreased   Gait Assessment/Treatment   Total Distance Ambulated 340   Independence  contact guard assist   Assistive Device  walker, front wheeled   Impairments  endurance;strength decreased;pain   Comment nolob , reciprocal gait   Stairs Assessment/Treatment   Number of Stairs 4   Handrail Location both sides   Independence Level stand-by assistance   Balance   Sitting Balance: Static good balance   Sitting Balance: Dynamic good balance   Sit-to-Stand Balance good balance   Standing Balance: Static good balance   Standing Balance: Dynamic fair + balance   Therapeutic Exercise   Comment group time: [phone] patient particpated with group therapy, did well with ex program forhome, all quesitons  answered   Post Treatment Status   Post Treatment Patient Status Patient sitting in bedside chair or w/c   Communication Post Treatement Charge Nurse   Patient Effort excellent   Post-Treatment Pain   Posttreatment Pain Rating 6/10   Cognitive Assessment/Intervention   Behavior/Mood Observations behavior appropriate to situation, WNL/WFL   RLE Assessment   Right Knee Flexion 90   Right Knee Extensor Lag 13   Physical Therapy Time and Intention   Total PT Minutes: 56   Therapy Plan Review/Discharge Plan (PT)   Anticipated Discharge Disposition home with assist;home with outpatient services     Patient gaited with rw 377ft, nolob, did well with exprogram for home, all questions answered          Physical Therapy Inpatient TKA D/C Criteria        PATIENT/CAREGIVER EDUCATION  Educated on exercise program? YES  Can successfully demonstrate exercise form? YES  Can demonstrate safe/effective assistance with functional mobility? YES     IMPAIRMENT BASED CRITERIA  Does the patient demonstrate knee ROM of at least 8-75 degrees on the involved leg? NO and COMMENT lacking more extension  Does the patient demonstrate a minimum of 3+/5 quadricep strength? YES  Does the patient demonstrate the ability to maintain any weight bearing precautions? YES  Does the patient demonstrated sensation of all LE dermatomes? YES  Does the patient have adequate pain control of <4/10 for functional mobility at home? YES and COMMENT with pain med  FUNCTIONAL BASED CRITERIA  Does that patient demonstrate the ability to ambulate 80 feet with RW using CGA/SPV assistance? YES  Does the patient demonstrate the ability to perform bed mobility with no more than min assist? YES  Does the patient demonstrated the ability to sit to stand from the bed or chair with min assist? YES  Does the patient demonstrate the ability to walk up/down 3-5 stairs with min assist as required for home entry? YES    ROM  Extension 13  Flexion 90      THERAPIST  Orie Molt,  PTA  02/05/2024, 12:26       Intervention minutes: GROUP THERAPEUTIC EXERCISE 45 MINUTES and GAIT TRAINING [address] MINUTES    THERAPIST  Orie Molt, PTA  02/05/2024, 12:26

## 2024-02-05 NOTE — Discharge Summary (Signed)
 [facility]  DISCHARGE SUMMARY    PATIENT NAME:  Alec Friedman, Alec Friedman  MRN:  [id number]  DOB:  03/13/1970    ENCOUNTER DATE:  02/04/2024  INPATIENT ADMISSION DATE: 02/04/2024  DISCHARGE DATE:  02/05/2024     ATTENDING PHYSICIAN: Joesph Kocher, Ramirez-Perez [[id number]*   SERVICE: [facility]  PRIMARY CARE PHYSICIAN: Joen CROME Hazel, APRN, CNP       No lay caregiver identified.    PRIMARY DISCHARGE DIAGNOSIS: Osteoarthritis  Active Hospital Problems    Diagnosis Date Noted    Principal Problem: Osteoarthritis [M19.90] 02/04/2024      Resolved Hospital Problems   No resolved problems to display.     There are no active non-hospital problems to display for this patient.       Allergies[1]  HOSPITAL PROCEDURE(S):   No orders of the defined types were placed in this encounter.    Surgical/Procedural Cases on this Admission       Case IDs Date Procedure Surgeon Location Status    [phone] 02/04/24 MAKO ROBOTIC ASSISTED RIGHT TOTAL KNEE ARTHROPLASTY USING TRIATHLON IMPLANTS Joesph Kocher, DO [facility]          REASON FOR HOSPITALIZATION AND HOSPITAL COURSE   BRIEF HPI:  This is a 54 y.o., male admitted for   Surgical/Procedural Cases on this Admission       Case IDs Date Procedure Surgeon Location Status    [phone] 02/04/24 MAKO ROBOTIC ASSISTED RIGHT TOTAL KNEE ARTHROPLASTY USING TRIATHLON IMPLANTS Joesph Kocher, DO [facility]          BRIEF HOSPITAL NARRATIVE:     Excellent post op progress.  ASA for DVT prophylaxis.   TRANSITION/POST DISCHARGE CARE/PENDING TESTS/REFERRALS: PCP, OPPT    CONDITION ON DISCHARGE:  A. Ambulation: With assistive device  B. Self-care Ability: With partial assist  C. Cognitive Status   A/O X3  D. Code status at discharge:       LINES/DRAINS/WOUNDS AT DISCHARGE:   Patient Lines/Drains/Airways Status       Active Line / Dialysis Catheter / Dialysis Graft / Drain / Airway / Wound       Name Placement date Placement time Site Days    Peripheral IV Left;Proximal Basilic  (medial  side of arm) 02/04/24  0705  -- 1    Wound  Incision Right Knee 03/03/23  --  -- 339    Wound  Incision Anterior;Right Knee 02/04/24  --  -- 1                    Results for orders placed or performed during the hospital encounter of 02/04/24 (from the past 24 hours)   BASIC METABOLIC PANEL, NON-FASTING   Result Value Ref Range    SODIUM 137 136 - 145 mmol/L    POTASSIUM 4.8 3.5 - 5.1 mmol/L    CHLORIDE 100 98 - 107 mmol/L    CO2 TOTAL 30 21 - 31 mmol/L    ANION GAP 7 4 - 13 mmol/L    CALCIUM 8.7 8.6 - 10.3 mg/dL    GLUCOSE 845 (H) 74 - 109 mg/dL    BUN 17 7 - 25 mg/dL    CREATININE 8.90 9.39 - 1.30 mg/dL    BUN/CREA RATIO 16 6 - 22    ESTIMATED GFR 82 >59 mL/min/1.50m2    OSMOLALITY, CALCULATED 279 270 - 290 mOsm/kg   CBC WITH DIFF   Result Value Ref Range    WBC 15.1 (  H) 3.6 - 10.2 x103/uL    RBC 3.96 (L) 4.06 - 5.63 x106/uL    HGB 12.1 (L) 12.5 - 16.3 g/dL    HCT 64.7 (L) 63.2 - 47.1 %    MCV 89.0 73.0 - 96.2 fL    MCH 30.6 23.8 - 33.4 pg    MCHC 34.4 32.5 - 36.3 g/dL    RDW 86.5 87.8 - 83.7 %    PLATELETS 278 140 - 440 x103/uL    MPV 8.4 7.4 - 11.4 fL    NEUTROPHIL % 92 (H) 44 - 74 %    LYMPHOCYTE % 4 (L) 15 - 43 %    MONOCYTE % 3 (L) 6 - 14 %    EOSINOPHIL % 0 (L) 1 - 8 %    BASOPHIL % 0 0 - 1 %    NEUTROPHIL # 13.90 (H) 1.85 - 7.84 x103/uL    LYMPHOCYTE # 0.70 (L) 1.00 - 3.00 x103/uL    MONOCYTE # 0.50 0.30 - 1.10 x103/uL    EOSINOPHIL # 0.00 0.00 - 0.60 x103/uL    BASOPHIL # 0.10 0.00 - 0.10 x103/uL   SCAN DIFFERENTIAL   Result Value Ref Range    SCHISTOCYTES Absent     RBC MORPHOLOGY COMMENT Normal     PLATELET MORPHOLOGY COMMENT Normal         Medications at DC       Current Discharge Medication List        CONTINUE these medications - NO CHANGES were made during your visit.        Details   ALPRAZolam 0.5 mg Tablet  Commonly known as: XANAX   0.5 mg, DAILY  Refills: 0     buPROPion  150 mg tablet sustained-release 12 hr  Commonly known as: WELLBUTRIN  SR   150 mg, Daily  Refills: 0      cyanocobalamin 1,000 mcg/mL Solution  Commonly known as: VITAMIN B12   1,000 mcg, EVERY 14 DAYS  Refills: 0     diclofenac  sodium 75 mg Tablet, Delayed Release (E.C.)  Commonly known as: VOLTAREN    75 mg, Oral, 2 TIMES DAILY  Qty: 30 Tablet  Refills: 0     * gabapentin  800 mg Tablet  Commonly known as: NEURONTIN    800 mg, 3 TIMES DAILY  Refills: 0     * gabapentin  300 mg Capsule  Commonly known as: NEURONTIN    300 mg, NIGHTLY  Refills: 0     HYDROcodone -acetaminophen  10-325 mg Tablet  Commonly known as: NORCO   1 Tablet, EVERY 6 HOURS PRN  Refills: 0     meloxicam 15 mg Tablet  Commonly known as: MOBIC   15 mg, Daily  Refills: 0     Movantik 25 mg Tablet  Generic drug: naloxegol   25 mg, EVERY MORNING  Refills: 0     rOPINIRole  3 mg Tablet  Commonly known as: REQUIP    1 Tablet, NIGHTLY  Refills: 0     venlafaxine  75 mg Capsule, Sust. Release 24 hr  Commonly known as: EFFEXOR  XR   75 mg, Oral, Daily  Refills: 0     verapamil  80 mg Tablet  Commonly known as: CALAN    80 mg, 2 TIMES DAILY  Refills: 0           * This list has 2 medication(s) that are the same as other medications prescribed for you. Read the directions carefully, and ask your doctor or other care provider to review them with you.  DISCHARGE DISPOSITION:  Home discharge and Outpatient PT   DISCHARGE INSTRUCTIONS:  Post-Discharge Follow Up Appointments       Monday Feb 15, 2024    Return Patient Visit with Ezzard Shuck, APRN, CNP at  2:10 PM      Tuesday Feb 16, 2024    Follow up with Alton Joen CROME, APRN, CNP    Phone: [phone]    Where: [address] [facility] [address], [place] [address]      Wednesday Feb 17, 2024    Pulmonary Function Test with Pft Tech Pulm Function, Prn at  4:00 PM      Thursday Feb 18, 2024    Follow up with Joesph Kocher, DO    Phone: [phone]    Where: [facility]      Pulmonary and Sleep Disorders, [facility], [place]   [address]  [place] [address]  [phone] Pulmonary Function Lab, [facility], [place]   [address]   [place] [address]  [phone]             Refer to Home Health - EXTERNAL     PT EVALUATE AND TREAT- OUTPATIENT     Need to be addressed: EVALUATE AND TREAT    Reason: Post TKR protocol      DME - WALKER Front Wheeled    Please note - If patient is 300 lbs or greater please order bariatric or heavy duty items.     Patient has mobility limits that significantly impairs ability to participate in one or more mobility related ADL's (MRADL's): Yes    Moblity Limitations: Pt at heightened risk of injury r/t attempts to fulfill MRADL's & can safely use walker which resolves issue    Walker Type: Front Wheeled    Freedom of Choice: I have informed patient of their freedom of choice with respect to DME providers      DME - BEDSIDE COMMODE    A standard commode is covered when the patient is physically incapable of utilizing regular toilet facilities.  An extra wide/heavy duty commode chair is covered for a patient who weighs 300 pounds or more.     I certify that a bedside commode is necessary due to Patient is confined to one level of home and there is no toilet on that level    Bedside Commode Type Standard Bedside Commode Extra wide for pt>BMI 40   Freedom of Choice: I have informed patient of their freedom of choice with respect to DME providers           Aleene JINNY Amabile, MD    Copies sent to Care Team         Relationship Specialty Notifications Start End    Alton Joen CROME, APRN, CNP PCP - General FAMILY NURSE PRACTITIONER  05/27/21     Phone: [phone] Fax: [phone]         [address] [place] [address]            Referring providers can utilize [email or web address] to access their referred [facility] patient's information.                             [1] No Known Allergies

## 2024-02-05 NOTE — Nurses Notes (Signed)
 Discharge instructions provided to patient regarding activity, wound care, f/u appointments, s/s to report to md, s/s to seek immediate assist, and medications with side effects. IV removed intact. Provided with nozin and educational handout. Provided with rx for Norco. Pt taken via wheelchair with all belongings to friend car. Pain 4/10 at that time.

## 2024-02-05 NOTE — Discharge Instructions (Signed)
 Orthopaedic Discharge Instructions:     Weightbear as tolerated to the operative extremity with use of an assistive device as needed upon ambulation     Maintain surgical dressing until postoperative day 7.  May remove dressing at this time and leave incision open to air if dry.  If there is persistent drainage please cover with a gauze dressing and change daily. Please call the clinic with any signs of infection such as erythema or purulent drainage or signs of blood clots such calf pain/redness or shortness of breath.     May shower post operative day 4, no tub baths until follow-up      Take pain medication and blood thinners as prescribed. Attend physical therapy as instructed.     Follow-up in the orthopedics clinic at your scheduled appointment in approximately 2-3 weeks.  Please call the office to confirm your appointment.  Please also call with any questions or concerns.        [facility]  [address], [place], [place] [address]  [phone]

## 2024-02-05 NOTE — Nurses Notes (Signed)
 Pt has voided total of 1300 this shift.

## 2024-02-05 NOTE — OT Evaluation (Addendum)
 [facility]  [address]  [place], [address]  [phone]  (Fax) [phone]  Rehabilitation Services  Occupational Therapy Inpatient Initial Evaluation      Patient Name: Alec Friedman  Date of Birth: November 21, 1970  Height: Height: 175.3 cm (5' 9)  Weight: Weight: 95.3 kg (210 lb)  Room/Bed: 382/A  Payor: [place] MEDICARE / Plan: [place] MEDICARE ADVANTAGE HMO / Product Type: MEDICARE [facility] /         PMH:   Past Medical History:   Diagnosis Date    Back pain     Chronic pain     Kidney stones     MI (myocardial infarction)     Muscle weakness     Neck problem     steel plate    Nerve damage     neck, spine, legs    Oxygen dependent     2 liters as needed    Vitamin B 12 deficiency            Assessment:      Functional Level at Time of Session: (P) Alec Friedman is a 54 y.o male post op R TKA. At baseline he is indpendent w/ all ADLs. During OT eval he was alert and  oriented x4 and cooperative. His BUE AROM is WNL and strength is 5/5. He completed STS w/ FWW SBA and don/doffed socks independently. Pt voiced no conerns about going home. OT eval complete. Recommend home w/ assist upon d/c.    Discharge Needs:   Equipment Recommendation:  FWW    The patient presents with mobility limitations due to impaired range of motion and pain that significantly impair/prevent patients ability to participate in mobility-related activities of daily living (MRADLs) including  ambulation and transfers in order to safely complete, toileting, bathing, food preparation, laundering/household tasks, safely entering/exiting the home. This functional mobility deficit can be sufficiently resolved with the use of a Anticipated Equipment Needs at Discharge: (P) front wheeled walker  in order to decrease the risk of falls, morbidity, and mortality in performance of these MRADLs.  Patient is able to safely use this assistive device.    Discharge Disposition:  Home w/ assist and outpatient PT  services    JUSTIFICATION OF DISCHARGE RECOMMENDATION   Based on current diagnosis, functional performance prior to admission, and current functional performance, this patient DOES NOT require continued OT services in Anticipated Discharge Disposition: (P) home with assist, home with outpatient services  in order to achieve significant functional improvements.    Plan:   Current Intervention:  Predicted Duration of Therapy: (P) evaluation only    To provide Occupational therapy services  Therapy Frequency: (P) Evaluation Only for duration of Predicted Duration of Therapy: (P) evaluation only  .       The risks/benefits of therapy have been discussed with the patient/caregiver and he/she is in agreement with the established plan of care.       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    HX APPENDECTOMY      HX BACK SURGERY      HX CHOLECYSTECTOMY      KNEE SURGERY Right     NECK SURGERY      SHOULDER SURGERY Left        ipp    INSERT FLOW SHEET     02/05/24 9167   Rehab Session   Document Type evaluation   OT Visit Date 02/05/24   Total OT Minutes: 8   Patient Effort good   General Information   Patient Profile Reviewed yes   Medical Lines Telemetry;PIV Line   Respiratory Status room air   Pre Treatment Status   Pre Treatment Patient Status Telephone within reach;Call light within reach;Patient safety alarm activated;Nurse approved session;Patient seen in Rehab department in chair   Support Present Pre Treatment  None   Communication Pre Treatment   Nurse;Charge Nurse   Communication Pre Treatment Comment Cleared for OT   Mutuality/Individual Preferences   Anxieties, Fears or Concerns none voiced   Plan of Care Reviewed With patient   Living Environment   Lives With alone   Living Arrangements house   Home Assessment: No Problems Identified   Home Accessibility stairs to enter home   Living Environment Comment tub w/ shower chair   Home Main Entrance   Number of Stairs, Main Entrance one   Functional Level Prior   Ambulation 1 - assistive equipment  (SPC)   Transferring 0 - independent   Toileting 0 - independent   Bathing 0 - independent   Dressing 0 - independent   Eating 0 - independent   Communication 0 - understands/communicates without difficulty   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   Vital Signs   Pre SpO2 (%) 89   O2 Delivery Pre Treatment room air   Post SpO2 (%) 98   O2 Delivery Post Treatment room air   Coping/Psychosocial   Observed Emotional State calm;cooperative   Verbalized Emotional State acceptance   Cognition   Behavior/Mood Observations behavior appropriate to situation, WNL/WFL   Orientation Status oriented x 4   Attention WNL/WFL   Follows Commands WNL   RUE Assessment   RUE Assessment X- Exceptions   RUE ROM WFL   RUE Strength 5/5   LUE Assessment   LUE Assessment X-Exceptions   LUE ROM WFL   LUE Strength 5/5   Grip Strength   Grip Left (5/5) normal, left   Right Grip (5/5) normal, right   Lower Body Dressing Assessment/Training   Comment independent w/ LB dressing   Post Treatment Status   Post Treatment Patient Status Telephone within reach;Patient safety alarm activated;Call light within reach;Other (See comments)  (Pt in rehab department in chair)   Support Present Post Treatment  None   Clinical Impression   Functional Level at Time of Session Alec Friedman is a 54 y.o male post op R TKA. At baseline he is indpendent w/ all ADLs. During OT eval he was alert and  oriented x4 and cooperative. His BUE AROM  is WNL and strength is 5/5. He completed STS w/ FWW SBA and don/doffed socks independently. Pt voiced no conerns about going home. OT eval complete. Recommend home w/ assist upon d/c.   Therapy Frequency Evaluation Only  Predicted Duration of Therapy evaluation only   Anticipated Equipment Needs at Discharge front wheeled walker   Anticipated Discharge Disposition home with assist;home with outpatient services   Evaluation Complexity Justification   Occupational Profile Review Brief history   Performance Deficits Range of motion;Pain   Clinical Decision Making Low analytic complexity   Evaluation Complexity Low           EVALUATION COMPLEXITY: CLINICAL DECISION MAKING OF LOW COMPLEXITY AS INDICATED BY PMH, OCCUPATIONAL THERAPY ASSESSMENT OF MUSCULOSKELETAL AND NEUROLOGICAL SYSTEMS AND ACTIVITY LIMITATIONS. CLINICAL PRESENTATION IS STABLE AND UNCOMPLICATED.      EVALUATION 8 minutes    Therapist:      Lavanda Drought, OTD, OTR/L 02/05/2024 09:37

## 2024-02-05 NOTE — Nurses Notes (Incomplete)
 Drsg removed dry and intact.knee measuring 21/18/15.pt ambulated total of 160 feet with staff using walker and gait belt.

## 2024-02-14 NOTE — Progress Notes (Unsigned)
 PULMONARY AND SLEEP DISORDERS, [facility]  [address]  [place] [address]  Operated by [facility]     Follow up Sleep Note    Patient Name: Alec Friedman  Date: 02/15/2024  Department:  PULMONARY AND SLEEP DISORDERS, [facility]  MRN: [id number]  DOB: 27-Mar-1970  Primary Care Provider:  Joen LITTIE Hazel, APRN, CNP  Referring Provider:  No ref. provider found      Chief Complaint: No chief complaint on file.        History of Present Illness         Epworth assessment done in office today with a total score of ***    Oxygen at 2 LPM at night.      Occasional shortness of breath with exertion like walking long distance.       Never smoker      Results    Polysomnography results reviewed from 01/22/24 and results showing desaturation of oxygen with lowest at 83 % And staying at or below 88% for 8.7 Minutes. Patient shown to have no significant obstructive sleep apnea (OSA) with AHI at 2.6/hour.     Chest CT done 01/28/24 showing No concerning nodule. Prior minimal nodular density in the lingula is no longer seen. Minimal linear atelectasis or scarring in the bilateral lower lobe and lingula. Lungs and airways are otherwise clear. Pleura: No pleural effusion. No pneumothorax.     Chest CT done 04/10/23 showing There is a nodule or groundglass density in the lingula measuring 9 mm. A small focus of infection is not excluded. Follow-up after treatment to document resolution should be performed. There are bands of atelectasis in the lower lobes.  The airway is patent.  No pleural effusion.  No pneumothorax.     Chest x-ray 08/06/23 showing Mild linear left basilar opacity favors atelectasis. No pleural effusions or pneumothorax. 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    HX APPENDECTOMY      HX BACK SURGERY      HX CHOLECYSTECTOMY      KNEE SURGERY Right     NECK SURGERY      SHOULDER SURGERY Left      Medication List  Current Outpatient Medications   Medication Sig    ALPRAZolam (XANAX) 0.5 mg Oral Tablet Take 1 Tablet (0.5 mg total) by mouth Once a day    aspirin  (ECOTRIN) 81 mg Oral Tablet, Delayed Release (E.C.) Take 1 Tablet (81 mg total) by mouth Twice daily X 6 weeks    buPROPion  (WELLBUTRIN  SR) 150 mg Oral tablet sustained-release 12 hr Take 1 Tablet (150 mg total) by mouth Daily    cyanocobalamin (VITAMIN B12) 1,000 mcg/mL Injection Solution Inject 1 mL (1,000 mcg total) under the skin Every  14 days    diclofenac  sodium (VOLTAREN ) 75 mg Oral Tablet, Delayed Release (E.C.) Take 1 Tablet (75 mg total) by mouth Twice daily    gabapentin  (NEURONTIN ) 300 mg Oral Capsule Take 1 Capsule (300 mg total) by mouth Every night    gabapentin  (NEURONTIN ) 800 mg Oral Tablet Take 1 Tablet (800 mg total) by mouth Three times a day    HYDROcodone -acetaminophen  (NORCO) 10-325 mg Oral Tablet Take 1 Tablet by mouth Every 6 hours as needed for Pain    HYDROcodone -acetaminophen  (NORCO) 7.5-325 mg Oral Tablet Take 1 Tablet by mouth Every 4 hours as needed for Pain    meloxicam (MOBIC) 15 mg Oral Tablet Take 1 Tablet (15 mg total) by mouth Daily    MOVANTIK 25 mg Oral Tablet Take 1 Tablet (25 mg total) by mouth Every morning    rOPINIRole  (REQUIP ) 3 mg Oral Tablet Take 1 Tablet (3 mg total) by mouth Every night    venlafaxine  (EFFEXOR  XR) 75 mg Oral Capsule, Sust. Release 24 hr Take 1 Capsule (75 mg total) by mouth Daily    verapamiL  (CALAN ) 80 mg Oral Tablet Take 1 Tablet (80 mg total) by mouth Twice daily 2 tab in am  and 1 tab evening     Allergy List  Allergy History as of 02/14/24        No Known Allergies                  Family History   Family Medical History:    None         Social History  Social History     Socioeconomic History    Marital status: Divorced   Tobacco Use    Smoking status: Never     Passive exposure: Never    Smokeless tobacco: Current     Types: Chew   Vaping Use    Vaping status: Never Used   Substance and Sexual Activity    Alcohol  use: Never    Drug use: Never   Other Topics Concern    Ability to Walk 1 Flight of Steps without SOB/CP No    Routine Exercise No    Ability to Walk 2 Flight of Steps without SOB/CP No    Unable to Ambulate No    Total Care No    Ability To Do Own ADL's Yes    Uses Walker No    Uses Cane Yes     Social Determinants of Health     Social Connections: Low Risk (02/04/2024)    Social Connections     SDOH Social Isolation: 5 or more times a week        Review of system  General:  Denies fever or chills.  Neurological:  Denies seizures.  Ears, Nose, Throat: Denies ear pain or sore throat.   Gastrointestinal:  Denies uncontrolled reflux, nausea, or diarrhea.  Cardiovascular:  Denies chest pain.  Respiratory: see HPI  Musculoskeletal:  Denies uncontrolled joint pain.  Mental Status/Psychiatric:  Denies uncontrolled anxiety or depression.  Integumentary:  Denies any new or untreated rash or abnormal skin lesions.    Vital Signs  There were no vitals filed for this visit.       Physical Exam         No diagnosis found.   Assessment & Plan      No orders of the defined types were placed in this encounter.      Continue medications as prescribed/directed unless changed by provider.  Plan of care discussed with patient.    No follow-ups on file.   and/or as needed.  Patient was advised to come back earlier if any symptoms get worse.  Also advised to come back for results of any test done.  If unable to keep regular appointment, patient was advised to schedule another appointment as soon  as possible.     The patient was given the opportunity to ask questions and those questions were answered to the patient's satisfaction. The patient was encouraged to call with any additional questions or concerns. Discussed with the patient effects and side effects of medications. Medication safety was discussed.  The patient was informed to contact the office within 7 business days if a message/lab results/referral/imaging results have not been conveyed to the patient.    Electronically signed by Ronal Kerns, APRN, CNP   Pulmonary and Critical care    This note was created with assistance from [employer] via capture of conversational audio. Consent was obtained from the patient and all parties present prior to recording.      This note may have been partially generated using [employer] Fluency Direct system, and there may be some incorrect words, spellings, and punctuation that were not noted in checking the note before saving.

## 2024-02-15 ENCOUNTER — Ambulatory Visit (HOSPITAL_COMMUNITY): Payer: Self-pay | Admitting: SLEEP MEDICINE

## 2024-02-17 ENCOUNTER — Ambulatory Visit: Admission: RE | Admit: 2024-02-17 | Discharge: 2024-02-17 | Payer: Self-pay | Attending: SLEEP MEDICINE

## 2024-02-17 ENCOUNTER — Other Ambulatory Visit: Payer: Self-pay

## 2024-02-17 DIAGNOSIS — G4734 Idiopathic sleep related nonobstructive alveolar hypoventilation: Secondary | ICD-10-CM

## 2024-02-17 DIAGNOSIS — R0600 Dyspnea, unspecified: Secondary | ICD-10-CM
